# Patient Record
Sex: Female | Born: 1995 | Race: White | Hispanic: No | Marital: Married | Smoking: Never smoker
Health system: Southern US, Community
[De-identification: ages and names within clinical notes are randomized; demographics above are authoritative.]

## PROBLEM LIST (undated history)

## (undated) DIAGNOSIS — R000 Tachycardia, unspecified: Secondary | ICD-10-CM

## (undated) DIAGNOSIS — K219 Gastro-esophageal reflux disease without esophagitis: Secondary | ICD-10-CM

## (undated) DIAGNOSIS — M21611 Bunion of right foot: Secondary | ICD-10-CM

## (undated) HISTORY — PX: NO PAST SURGERIES: SHX2092

---

## 2017-04-30 ENCOUNTER — Emergency Department (HOSPITAL_COMMUNITY)
Admission: EM | Admit: 2017-04-30 | Discharge: 2017-04-30 | Disposition: A | Discharge status: Home / Self Care | Payer: BLUE CROSS/BLUE SHIELD | Attending: Emergency Medicine | Admitting: Emergency Medicine

## 2017-04-30 ENCOUNTER — Encounter (HOSPITAL_COMMUNITY): Payer: Self-pay

## 2017-04-30 ENCOUNTER — Emergency Department (HOSPITAL_COMMUNITY): Payer: BLUE CROSS/BLUE SHIELD

## 2017-04-30 DIAGNOSIS — R1084 Generalized abdominal pain: Secondary | ICD-10-CM | POA: Insufficient documentation

## 2017-04-30 DIAGNOSIS — R1032 Left lower quadrant pain: Secondary | ICD-10-CM | POA: Diagnosis present

## 2017-04-30 DIAGNOSIS — R112 Nausea with vomiting, unspecified: Secondary | ICD-10-CM | POA: Insufficient documentation

## 2017-04-30 LAB — COMPREHENSIVE METABOLIC PANEL
ALBUMIN: 4.3 g/dL (ref 3.5–5.0)
ALT: 26 U/L (ref 14–54)
AST: 22 U/L (ref 15–41)
Alkaline Phosphatase: 53 U/L (ref 38–126)
Anion gap: 9 (ref 5–15)
BUN: 11 mg/dL (ref 6–20)
CHLORIDE: 105 mmol/L (ref 101–111)
CO2: 25 mmol/L (ref 22–32)
CREATININE: 0.64 mg/dL (ref 0.44–1.00)
Calcium: 9.3 mg/dL (ref 8.9–10.3)
GFR calc non Af Amer: >60 mL/min (ref >60)
GLUCOSE: 92 mg/dL (ref 65–99)
Potassium: 3.6 mmol/L (ref 3.5–5.1)
SODIUM: 139 mmol/L (ref 135–145)
Total Bilirubin: 0.6 mg/dL (ref 0.3–1.2)
Total Protein: 7.8 g/dL (ref 6.5–8.1)

## 2017-04-30 LAB — CBC
HEMATOCRIT: 36.8 % (ref 36.0–46.0)
Hemoglobin: 11.9 g/dL — ABNORMAL LOW (ref 12.0–15.0)
MCH: 27.1 pg (ref 26.0–34.0)
MCHC: 32.3 g/dL (ref 30.0–36.0)
MCV: 83.8 fL (ref 78.0–100.0)
PLATELETS: 241 10*3/uL (ref 150–400)
RBC: 4.39 MIL/uL (ref 3.87–5.11)
RDW: 14.8 % (ref 11.5–15.5)
WBC: 6.4 10*3/uL (ref 4.0–10.5)

## 2017-04-30 LAB — URINALYSIS, ROUTINE W REFLEX MICROSCOPIC
BILIRUBIN URINE: NEGATIVE
GLUCOSE, UA: NEGATIVE mg/dL
HGB URINE DIPSTICK: NEGATIVE
KETONES UR: NEGATIVE mg/dL
LEUKOCYTES UA: NEGATIVE
Nitrite: NEGATIVE
PH: 6 (ref 5.0–8.0)
PROTEIN: NEGATIVE mg/dL
Specific Gravity, Urine: 1.012 (ref 1.005–1.030)

## 2017-04-30 LAB — WET PREP, GENITAL
SPERM: NONE SEEN
Trich, Wet Prep: NONE SEEN
Yeast Wet Prep HPF POC: NONE SEEN

## 2017-04-30 LAB — PREGNANCY, URINE: Preg Test, Ur: NEGATIVE

## 2017-04-30 LAB — LIPASE, BLOOD: LIPASE: 24 U/L (ref 11–51)

## 2017-04-30 MED ORDER — IOPAMIDOL (ISOVUE-300) INJECTION 61%
100.0000 mL | Freq: Once | INTRAVENOUS | Status: AC | PRN
  Administered 2017-04-30: 100 mL via INTRAVENOUS

## 2017-04-30 MED ORDER — NAPROXEN 500 MG PO TABS: 500.0000 mg | ORAL_TABLET | Freq: Two times a day (BID) | ORAL | 0 refills | Status: DC

## 2017-04-30 NOTE — ED Triage Notes (Signed)
Reports of pressure with urination and LLQ pain since Friday.

## 2017-04-30 NOTE — ED Provider Notes (Signed)
[facility] DEPT Provider Note   CSN: [id number] Arrival date & time: 04/30/17  1653     History   Chief Complaint Chief Complaint  Patient presents with  . Abdominal Pain    HPI Jessica Wu is a 21 y.o. female.  HPI   Jessica Wu is a 21 y.o. female who presents to the Emergency Department complaining of lower abdominal pain for 3 days. She states the pain began in the left lower quadrant and now radiates to the right and to her left flank.  Pain at times is sharp. It is not associated with food intake or movement. Pain is not improved with over-the-counter pain relievers.  She states the pain is waxing and waning at times and other times it is sharper and constant.  She denies fever, nausea or vomiting, dysuria, vaginal discharge and abnormal vaginal bleeding. She is on oral birth control, but states she has not been taking it regularly for several days stating that it exacerbates her pain.   History reviewed. No pertinent past medical history.  There are no active problems to display for this patient.   History reviewed. No pertinent surgical history.  OB History    No data available       Home Medications    Prior to Admission medications   Not on File    Family History No family history on file.  Social History Social History  Substance Use Topics  . Smoking status: Never Smoker  . Smokeless tobacco: Never Used  . Alcohol use No     Allergies   Patient has no known allergies.   Review of Systems Review of Systems  Constitutional: Negative for appetite change, chills and fever.  Respiratory: Negative for shortness of breath.   Cardiovascular: Negative for chest pain.  Gastrointestinal: Positive for abdominal pain, nausea and vomiting. Negative for blood in stool.  Genitourinary: Negative for decreased urine volume, difficulty urinating, dysuria and flank pain.  Musculoskeletal: Negative for back pain.  Skin: Negative for color  change and rash.  Neurological: Negative for dizziness, weakness and numbness.  Hematological: Negative for adenopathy.  All other systems reviewed and are negative.    Physical Exam Updated Vital Signs BP 134/85 (BP Location: Right Arm)   Pulse 91   Temp 98.7 F (37.1 C) (Oral)   Resp 18   Ht  (1.626 m)   Wt 61.2 kg (135 lb)   LMP 04/12/2017   SpO2 100%   BMI 23.17 kg/m   Physical Exam  Constitutional: She is oriented to person, place, and time. She appears well-developed and well-nourished. No distress.  HENT:  Head: Normocephalic and atraumatic.  Mouth/Throat: Oropharynx is clear and moist.  Cardiovascular: Normal rate, regular rhythm, normal heart sounds and intact distal pulses.   No murmur heard. Pulmonary/Chest: Effort normal and breath sounds normal. No respiratory distress.  Abdominal: Soft. Normal appearance and bowel sounds are normal. She exhibits no distension and no mass. There is no hepatosplenomegaly. There is tenderness in the right lower quadrant, left upper quadrant and left lower quadrant. There is no rebound, no guarding and no CVA tenderness.    Diffuse ttp of the lower abdomen and pelvis and LUQ.  No rebound or guarding.    Genitourinary: Uterus normal. There is no lesion or injury on the right labia. There is no lesion or injury on the left labia. Cervix exhibits no motion tenderness. Right adnexum displays no mass, no tenderness and no fullness. Left adnexum displays no mass,  no tenderness and no fullness. No tenderness or bleeding in the vagina. Vaginal discharge found.  Genitourinary Comments: Pelvic exam chaperoned by nursing.  Small amt of white vaginal discharge, no CMT, mild tenderness of left adnexa, no  masses or right  adnexal tenderness on exam.    Musculoskeletal: Normal range of motion. She exhibits no edema.  Neurological: She is alert and oriented to person, place, and time. She exhibits normal muscle tone. Coordination normal.  Skin:  Skin is warm and dry.  Psychiatric: She has a normal mood and affect.  Nursing note and vitals reviewed.    ED Treatments / Results  Labs (all labs ordered are listed, but only abnormal results are displayed) Labs Reviewed  CBC - Abnormal; Notable for the following:       Result Value   Hemoglobin 11.9 (*)    All other components within normal limits  URINALYSIS, ROUTINE W REFLEX MICROSCOPIC - Abnormal; Notable for the following:    Color, Urine STRAW (*)    All other components within normal limits  LIPASE, BLOOD  COMPREHENSIVE METABOLIC PANEL  PREGNANCY, URINE    EKG  EKG Interpretation None       Radiology Ct Abdomen Pelvis W Contrast  Result Date: 04/30/2017 CLINICAL DATA:  Lower abdominal pain for 3 days. Worsened with urination. EXAM: CT ABDOMEN AND PELVIS WITH CONTRAST TECHNIQUE: Multidetector CT imaging of the abdomen and pelvis was performed using the standard protocol following bolus administration of intravenous contrast. CONTRAST:  ISOVUE-300 IOPAMIDOL (ISOVUE-300) INJECTION 61% COMPARISON:  None. FINDINGS: Lower chest: No acute abnormality. Hepatobiliary: No focal liver abnormality is seen. No gallstones, gallbladder wall thickening, or biliary dilatation. Pancreas: Unremarkable. No pancreatic ductal dilatation or surrounding inflammatory changes. Spleen: Normal in size without focal abnormality. Adrenals/Urinary Tract: Adrenal glands are unremarkable. Kidneys are normal, without renal calculi, focal lesion, or hydronephrosis. Bladder is unremarkable. Stomach/Bowel: Stomach is within normal limits. Appendix is normal. No evidence of bowel wall thickening, distention, or inflammatory changes. Vascular/Lymphatic: No significant vascular findings are present. No enlarged abdominal or pelvic lymph nodes. Reproductive: Uterus and bilateral adnexa are unremarkable. Other: No focal inflammation.  Small volume free pelvic fluid. Musculoskeletal: No significant skeletal  abnormality. Tiny fat containing umbilical hernia. IMPRESSION: No significant abnormality. Electronically Signed   By: Ellery Plunk M.D.   On: 04/30/2017 18:55    Procedures Procedures (including critical care time)  Medications Ordered in ED Medications - No data to display   Initial Impression / Assessment and Plan / ED Course  I have reviewed the triage vital signs and the nursing notes.  Pertinent labs & imaging results that were available during my care of the patient were reviewed by me and considered in my medical decision making (see chart for details).     CT Abd/pelvis, labs and bimanual exam are reassuring.  Doubt acute abdomen.  Pt comfortable appearing.  Findings discussed.  Pt agrees to symptomatic tx, advised close PCP f/u.  Return ER precautions discussed.   Final Clinical Impressions(s) / ED Diagnoses   Final diagnoses:  Left lower quadrant pain    New Prescriptions New Prescriptions   No medications on file     Pauline Aus, Cordelia Poche 05/01/17 1547    Eber Hong, MD 05/01/17 2022

## 2017-04-30 NOTE — Discharge Instructions (Signed)
Labs and CT scan of your abdomen are reassuring.  Call your primary provider or GYN to arrange a follow-up appt.

## 2017-05-02 LAB — GC/CHLAMYDIA PROBE AMP (~~LOC~~) NOT AT ARMC
Chlamydia: NEGATIVE
Neisseria Gonorrhea: NEGATIVE

## 2017-06-23 DIAGNOSIS — M21611 Bunion of right foot: Secondary | ICD-10-CM

## 2017-06-23 HISTORY — DX: Bunion of right foot: M21.611

## 2017-06-25 ENCOUNTER — Other Ambulatory Visit: Payer: Self-pay | Admitting: Orthopedic Surgery

## 2017-06-29 ENCOUNTER — Other Ambulatory Visit: Payer: Self-pay

## 2017-06-29 ENCOUNTER — Encounter (HOSPITAL_BASED_OUTPATIENT_CLINIC_OR_DEPARTMENT_OTHER): Payer: Self-pay | Admitting: *Deleted

## 2017-07-05 ENCOUNTER — Ambulatory Visit (HOSPITAL_BASED_OUTPATIENT_CLINIC_OR_DEPARTMENT_OTHER): Payer: BLUE CROSS/BLUE SHIELD | Admitting: Certified Registered"

## 2017-07-05 ENCOUNTER — Other Ambulatory Visit: Payer: Self-pay

## 2017-07-05 ENCOUNTER — Ambulatory Visit (HOSPITAL_BASED_OUTPATIENT_CLINIC_OR_DEPARTMENT_OTHER)
Admission: RE | Admit: 2017-07-05 | Discharge: 2017-07-05 | Disposition: A | Discharge status: Home / Self Care | Payer: BLUE CROSS/BLUE SHIELD | Source: Ambulatory Visit | Attending: Orthopedic Surgery | Admitting: Orthopedic Surgery

## 2017-07-05 ENCOUNTER — Encounter (HOSPITAL_BASED_OUTPATIENT_CLINIC_OR_DEPARTMENT_OTHER): Payer: Self-pay | Admitting: *Deleted

## 2017-07-05 ENCOUNTER — Encounter (HOSPITAL_BASED_OUTPATIENT_CLINIC_OR_DEPARTMENT_OTHER)
Admission: RE | Disposition: A | Discharge status: Home / Self Care | Payer: Self-pay | Source: Ambulatory Visit | Attending: Orthopedic Surgery

## 2017-07-05 DIAGNOSIS — Z853 Personal history of malignant neoplasm of breast: Secondary | ICD-10-CM | POA: Insufficient documentation

## 2017-07-05 DIAGNOSIS — M21611 Bunion of right foot: Secondary | ICD-10-CM | POA: Diagnosis not present

## 2017-07-05 DIAGNOSIS — Z79899 Other long term (current) drug therapy: Secondary | ICD-10-CM | POA: Diagnosis not present

## 2017-07-05 DIAGNOSIS — Z793 Long term (current) use of hormonal contraceptives: Secondary | ICD-10-CM | POA: Insufficient documentation

## 2017-07-05 DIAGNOSIS — Z9889 Other specified postprocedural states: Secondary | ICD-10-CM

## 2017-07-05 HISTORY — DX: Bunion of right foot: M21.611

## 2017-07-05 HISTORY — PX: METATARSAL OSTEOTOMY WITH BUNIONECTOMY: SHX5662

## 2017-07-05 SURGERY — BUNIONECTOMY, WITH METATARSAL OSTEOTOMY
Anesthesia: General | Site: Foot | Laterality: Right

## 2017-07-05 MED ORDER — SCOPOLAMINE 1 MG/3DAYS TD PT72: 1.0000 | MEDICATED_PATCH | Freq: Once | TRANSDERMAL | Status: DC | PRN

## 2017-07-05 MED ORDER — CEFAZOLIN SODIUM-DEXTROSE 2-4 GM/100ML-% IV SOLN
2.0000 g | INTRAVENOUS | Status: AC
  Administered 2017-07-05: 2 g via INTRAVENOUS

## 2017-07-05 MED ORDER — MEPERIDINE HCL 25 MG/ML IJ SOLN: 6.2500 mg | INTRAMUSCULAR | Status: DC | PRN

## 2017-07-05 MED ORDER — LACTATED RINGERS IV SOLN
INTRAVENOUS | Status: DC
  Administered 2017-07-05 (×2): via INTRAVENOUS

## 2017-07-05 MED ORDER — FENTANYL CITRATE (PF) 100 MCG/2ML IJ SOLN
50.0000 ug | INTRAMUSCULAR | Status: DC | PRN
  Administered 2017-07-05: 100 ug via INTRAVENOUS

## 2017-07-05 MED ORDER — SENNA 8.6 MG PO TABS: 2.0000 | ORAL_TABLET | Freq: Two times a day (BID) | ORAL | 0 refills | Status: DC

## 2017-07-05 MED ORDER — FENTANYL CITRATE (PF) 100 MCG/2ML IJ SOLN: 25.0000 ug | INTRAMUSCULAR | Status: DC | PRN

## 2017-07-05 MED ORDER — LIDOCAINE HCL (CARDIAC) 20 MG/ML IV SOLN
INTRAVENOUS | Status: DC | PRN
  Administered 2017-07-05: 30 mg via INTRAVENOUS

## 2017-07-05 MED ORDER — DEXAMETHASONE SODIUM PHOSPHATE 10 MG/ML IJ SOLN
INTRAMUSCULAR | Status: DC | PRN
  Administered 2017-07-05: 10 mg via INTRAVENOUS

## 2017-07-05 MED ORDER — ONDANSETRON HCL 4 MG/2ML IJ SOLN
INTRAMUSCULAR | Status: DC | PRN
  Administered 2017-07-05: 4 mg via INTRAVENOUS

## 2017-07-05 MED ORDER — 0.9 % SODIUM CHLORIDE (POUR BTL) OPTIME
TOPICAL | Status: DC | PRN
  Administered 2017-07-05: 150 mL

## 2017-07-05 MED ORDER — SODIUM CHLORIDE 0.9 % IV SOLN: INTRAVENOUS | Status: DC

## 2017-07-05 MED ORDER — ONDANSETRON HCL 4 MG/2ML IJ SOLN: 4.0000 mg | Freq: Once | INTRAMUSCULAR | Status: DC | PRN

## 2017-07-05 MED ORDER — DOCUSATE SODIUM 100 MG PO CAPS: 100.0000 mg | ORAL_CAPSULE | Freq: Two times a day (BID) | ORAL | 0 refills | Status: DC

## 2017-07-05 MED ORDER — FENTANYL CITRATE (PF) 100 MCG/2ML IJ SOLN
INTRAMUSCULAR | Status: AC
  Filled 2017-07-05: qty 2

## 2017-07-05 MED ORDER — PROPOFOL 10 MG/ML IV BOLUS
INTRAVENOUS | Status: DC | PRN
  Administered 2017-07-05: 150 mg via INTRAVENOUS

## 2017-07-05 MED ORDER — CHLORHEXIDINE GLUCONATE 4 % EX LIQD: 60.0000 mL | Freq: Once | CUTANEOUS | Status: DC

## 2017-07-05 MED ORDER — MIDAZOLAM HCL 2 MG/2ML IJ SOLN
INTRAMUSCULAR | Status: AC
  Filled 2017-07-05: qty 2

## 2017-07-05 MED ORDER — CEFAZOLIN SODIUM-DEXTROSE 2-4 GM/100ML-% IV SOLN
INTRAVENOUS | Status: AC
  Filled 2017-07-05: qty 100

## 2017-07-05 MED ORDER — MIDAZOLAM HCL 2 MG/2ML IJ SOLN
1.0000 mg | INTRAMUSCULAR | Status: DC | PRN
  Administered 2017-07-05: 2 mg via INTRAVENOUS

## 2017-07-05 MED ORDER — OXYCODONE HCL 5 MG PO TABS: 5.0000 mg | ORAL_TABLET | ORAL | 0 refills | Status: DC | PRN

## 2017-07-05 MED ORDER — ROPIVACAINE HCL 7.5 MG/ML IJ SOLN
INTRAMUSCULAR | Status: DC | PRN
  Administered 2017-07-05: 25 mL via PERINEURAL

## 2017-07-05 SURGICAL SUPPLY — 66 items
BANDAGE ESMARK 6X9 LF (GAUZE/BANDAGES/DRESSINGS) IMPLANT
BIT DRILL 1.5X30 QC DISP (BIT) ×2 IMPLANT
BLADE AVERAGE 25X9 (BLADE) ×2 IMPLANT
BLADE LONG MED 25X9 (BLADE) IMPLANT
BLADE MICRO SAGITTAL (BLADE) IMPLANT
BLADE SURG 15 STRL LF DISP TIS (BLADE) ×2 IMPLANT
BLADE SURG 15 STRL SS (BLADE) ×2
BNDG COHESIVE 4X5 TAN STRL (GAUZE/BANDAGES/DRESSINGS) ×2 IMPLANT
BNDG COHESIVE 6X5 TAN STRL LF (GAUZE/BANDAGES/DRESSINGS) IMPLANT
BNDG CONFORM 3 STRL LF (GAUZE/BANDAGES/DRESSINGS) ×2 IMPLANT
BNDG ESMARK 6X9 LF (GAUZE/BANDAGES/DRESSINGS)
CHLORAPREP W/TINT 26ML (MISCELLANEOUS) ×2 IMPLANT
COVER BACK TABLE 60X90IN (DRAPES) ×2 IMPLANT
CUFF TOURNIQUET SINGLE 24IN (TOURNIQUET CUFF) IMPLANT
CUFF TOURNIQUET SINGLE 34IN LL (TOURNIQUET CUFF) ×2 IMPLANT
DRAPE EXTREMITY T 121X128X90 (DRAPE) ×2 IMPLANT
DRAPE OEC MINIVIEW 54X84 (DRAPES) ×2 IMPLANT
DRAPE U-SHAPE 47X51 STRL (DRAPES) ×2 IMPLANT
DRSG MEPITEL 4X7.2 (GAUZE/BANDAGES/DRESSINGS) ×2 IMPLANT
DRSG PAD ABDOMINAL 8X10 ST (GAUZE/BANDAGES/DRESSINGS) ×2 IMPLANT
ELECT REM PT RETURN 9FT ADLT (ELECTROSURGICAL) ×2
ELECTRODE REM PT RTRN 9FT ADLT (ELECTROSURGICAL) ×1 IMPLANT
GAUZE SPONGE 4X4 12PLY STRL (GAUZE/BANDAGES/DRESSINGS) ×2 IMPLANT
GLOVE BIO SURGEON STRL SZ8 (GLOVE) ×2 IMPLANT
GLOVE BIOGEL PI IND STRL 7.0 (GLOVE) ×2 IMPLANT
GLOVE BIOGEL PI IND STRL 8 (GLOVE) ×2 IMPLANT
GLOVE BIOGEL PI INDICATOR 7.0 (GLOVE) ×2
GLOVE BIOGEL PI INDICATOR 8 (GLOVE) ×2
GLOVE ECLIPSE 6.5 STRL STRAW (GLOVE) ×2 IMPLANT
GLOVE ECLIPSE 8.0 STRL XLNG CF (GLOVE) ×2 IMPLANT
GOWN STRL REUS W/ TWL LRG LVL3 (GOWN DISPOSABLE) ×1 IMPLANT
GOWN STRL REUS W/ TWL XL LVL3 (GOWN DISPOSABLE) ×2 IMPLANT
GOWN STRL REUS W/TWL LRG LVL3 (GOWN DISPOSABLE) ×1
GOWN STRL REUS W/TWL XL LVL3 (GOWN DISPOSABLE) ×2
GUIDEWIRE .08 (WIRE) IMPLANT
K-WIRE .054X4 (WIRE) ×2 IMPLANT
NEEDLE HYPO 25X1 1.5 SAFETY (NEEDLE) IMPLANT
NS IRRIG 1000ML POUR BTL (IV SOLUTION) ×2 IMPLANT
PACK BASIN DAY SURGERY FS (CUSTOM PROCEDURE TRAY) ×2 IMPLANT
PAD CAST 4YDX4 CTTN HI CHSV (CAST SUPPLIES) ×1 IMPLANT
PADDING CAST ABS 4INX4YD NS (CAST SUPPLIES)
PADDING CAST ABS COTTON 4X4 ST (CAST SUPPLIES) IMPLANT
PADDING CAST COTTON 4X4 STRL (CAST SUPPLIES) ×1
PENCIL BUTTON HOLSTER BLD 10FT (ELECTRODE) ×2 IMPLANT
SANITIZER HAND PURELL 535ML FO (MISCELLANEOUS) ×2 IMPLANT
SCREW 2.0 CORTICAL FT 20MM (Screw) ×2 IMPLANT
SCREW CORTICAL 2.0X18MM (Screw) ×2 IMPLANT
SHEET MEDIUM DRAPE 40X70 STRL (DRAPES) ×2 IMPLANT
SLEEVE SCD COMPRESS KNEE MED (MISCELLANEOUS) ×2 IMPLANT
SPONGE LAP 18X18 X RAY DECT (DISPOSABLE) ×2 IMPLANT
STOCKINETTE 6  STRL (DRAPES) ×1
STOCKINETTE 6 STRL (DRAPES) ×1 IMPLANT
SUCTION FRAZIER HANDLE 10FR (MISCELLANEOUS) ×1
SUCTION TUBE FRAZIER 10FR DISP (MISCELLANEOUS) ×1 IMPLANT
SUT ETHIBOND 3-0 V-5 (SUTURE) IMPLANT
SUT ETHILON 3 0 PS 1 (SUTURE) ×4 IMPLANT
SUT MNCRL AB 3-0 PS2 18 (SUTURE) ×2 IMPLANT
SUT VIC AB 0 SH 27 (SUTURE) IMPLANT
SUT VIC AB 2-0 SH 27 (SUTURE) ×1
SUT VIC AB 2-0 SH 27XBRD (SUTURE) ×1 IMPLANT
SUT VICRYL 0 UR6 27IN ABS (SUTURE) IMPLANT
SYR BULB 3OZ (MISCELLANEOUS) ×2 IMPLANT
SYR CONTROL 10ML LL (SYRINGE) IMPLANT
TOWEL OR 17X24 6PK STRL BLUE (TOWEL DISPOSABLE) ×4 IMPLANT
TUBE CONNECTING 20X1/4 (TUBING) ×2 IMPLANT
UNDERPAD 30X30 (UNDERPADS AND DIAPERS) ×2 IMPLANT

## 2017-07-05 NOTE — Anesthesia Preprocedure Evaluation (Signed)
Anesthesia Evaluation  Patient identified by MRN, date of birth, ID band Patient awake    Reviewed: Allergy & Precautions, NPO status , Patient's Chart, lab work & pertinent test results  Airway Mallampati: II  TM Distance: >3 FB Neck ROM: Full    Dental no notable dental hx.    Pulmonary neg pulmonary ROS,    Pulmonary exam normal breath sounds clear to auscultation       Cardiovascular negative cardio ROS Normal cardiovascular exam Rhythm:Regular Rate:Normal     Neuro/Psych negative neurological ROS  negative psych ROS   GI/Hepatic negative GI ROS, Neg liver ROS,   Endo/Other  negative endocrine ROS  Renal/GU negative Renal ROS  negative genitourinary   Musculoskeletal negative musculoskeletal ROS (+)   Abdominal   Peds negative pediatric ROS (+)  Hematology negative hematology ROS (+)   Anesthesia Other Findings   Reproductive/Obstetrics negative OB ROS                             Anesthesia Physical Anesthesia Plan  ASA: II  Anesthesia Plan: General   Post-op Pain Management: GA combined w/ Regional for post-op pain   Induction: Intravenous  PONV Risk Score and Plan: 3 and Ondansetron, Dexamethasone, Treatment may vary due to age or medical condition and Midazolam  Airway Management Planned: LMA  Additional Equipment:   Intra-op Plan:   Post-operative Plan: Extubation in OR  Informed Consent: I have reviewed the patients History and Physical, chart, labs and discussed the procedure including the risks, benefits and alternatives for the proposed anesthesia with the patient or authorized representative who has indicated his/her understanding and acceptance.     Plan Discussed with: CRNA and Surgeon  Anesthesia Plan Comments: ( )        Anesthesia Quick Evaluation

## 2017-07-05 NOTE — H&P (Signed)
Jessica Wu is an 21 y.o. female.   Chief Complaint: right foot pain HPI: The patient is a 21 year old female without significant past medical history.  She has a painful bunion deformity of her right foot.  She has failed nonoperative treatment to date including activity modification, oral anti-inflammatories and shoe wear modification.  She presents today for first metatarsal scarf osteotomy and modified McBride bunionectomy.  Past Medical History:  Diagnosis Date  . Bunion of right foot 06/2017    Past Surgical History:  Procedure Laterality Date  . NO PAST SURGERIES      History reviewed. No pertinent family history. Social History:  reports that  has never smoked. she has never used smokeless tobacco. She reports that she does not drink alcohol or use drugs.  Allergies:  Allergies  Allergen Reactions  . Soap Other (See Comments)    SCENTED SOAPS/LOTIONS - SKIN IRRITATION    Medications Prior to Admission  Medication Sig Dispense Refill  . Biotin 1000 MCG tablet Take 1,000 mcg by mouth daily.     . norgestimate-ethinyl estradiol (ORTHO-CYCLEN,SPRINTEC,PREVIFEM) 0.25-35 MG-MCG tablet Take 1 tablet by mouth daily.      No results found for this or any previous visit (from the past 48 hour(s)). No results found.  ROS no recent fever, chills, nausea, vomiting, changes in her appetite or weight loss.  Blood pressure 121/77, pulse 90, resp. rate 17, height 5\' 4"  (1.626 m), weight 67.6 kg (149 lb), last menstrual period 07/03/2017, SpO2 100 %. Physical Exam  Well-nourished well-developed young woman in no apparent distress.  Alert and oriented x4.  Mood and affect are normal.  Extraocular motions are intact.  Respirations are unlabored.  Her gait is normal.  On standing exam she has a moderate bunion deformity.  Skin is healthy and intact.  No lymphadenopathy.  Pulses are palpable.  Sensibility to light touch is intact dorsally and plantarly at the forefoot.  5 out of 5  strength in plantar flexion and dorsi flexion of the ankle and toes.  Assessment/Plan Right foot bunion deformity-to operating room for operative treatment.The risks and benefits of the alternative treatment options have been discussed in detail.  The patient wishes to proceed with surgery and specifically understands risks of bleeding, infection, nerve damage, blood clots, need for additional surgery, amputation and death.   Jessica Wu, Jessica Gatling, MD 07/05/2017, 1:14 PM

## 2017-07-05 NOTE — Progress Notes (Signed)
Assisted Dr. Oddono with right, ultrasound guided, popliteal block. Side rails up, monitors on throughout procedure. See vital signs in flow sheet. Tolerated Procedure well. 

## 2017-07-05 NOTE — Transfer of Care (Signed)
Immediate Anesthesia Transfer of Care Note  Patient: Jessica Wu  Procedure(s) Performed: Right First Metatarsal Scarf Osteotomy and Modified McBride Bunionectomy (Right Foot)  Patient Location: PACU  Anesthesia Type:General  Level of Consciousness: awake and sedated  Airway & Oxygen Therapy: Patient Spontanous Breathing and Patient connected to face mask oxygen  Post-op Assessment: Report given to RN and Post -op Vital signs reviewed and stable  Post vital signs: Reviewed and stable  Last Vitals:  Vitals:   07/05/17 1305 07/05/17 1310  BP: (!) 101/59 121/77  Pulse: 71 90  Resp: 15 17  SpO2: 100% 100%    Last Pain:  Vitals:   07/05/17 1227  TempSrc: Oral         Complications: No apparent anesthesia complications

## 2017-07-05 NOTE — Op Note (Signed)
07/05/2017  2:26 PM  PATIENT:  Jessica Wu  21 y.o. female  PRE-OPERATIVE DIAGNOSIS:  Right foot painful bunion  POST-OPERATIVE DIAGNOSIS:  same  Procedure(s): 1.  Right First Metatarsal Scarf Osteotomy and Modified McBride Bunionectomy   2.  Right foot AP and lateral xrays  SURGEON:  Toni ArthursJohn Neasia Fleeman, MD  ASSISTANT: Alfredo MartinezJustin Ollis, PA-C  ANESTHESIA:   General, regional  EBL:  minimal  TOURNIQUET:   Total Tourniquet Time Documented: Thigh (Right) - 42 minutes Total: Thigh (Right) - 42 minutes  COMPLICATIONS:  None apparent  DISPOSITION:  Extubated, awake and stable to recovery.  INDICATION FOR PROCEDURE: The patient is a 21 year old female who has a long history of a painful right foot bunion deformity.  She has failed nonoperative treatment to date including activity modification, oral anti-inflammatories and shoewear modification.  She presents now for operative treatment of this painful bunion.The risks and benefits of the alternative treatment options have been discussed in detail.  The patient wishes to proceed with surgery and specifically understands risks of bleeding, infection, nerve damage, blood clots, need for additional surgery, amputation and death.  PROCEDURE IN DETAIL:  After pre operative consent was obtained, and the correct operative site was identified, the patient was brought to the operating room and placed supine on the OR table.  Anesthesia was administered.  Pre-operative antibiotics were administered.  A surgical timeout was taken.  The right lower extremity was then prepped and draped in standard sterile fashion with a tourniquet around the thigh.  The extremity was exsanguinated and the tourniquet was inflated to 250 mmHg.  A longitudinal incision was made at the dorsum of the first web space.  Dissection was carried down through the subcutaneous tissues.  The intermetatarsal ligament was divided under direct vision.  An arthrotomy was then made between the  lateral sesamoid and first metatarsal head.  Small perforations were made in the lateral joint capsule.  The hallux could then be positioned in 20 degrees of varus passively.  Attention was turned to the medial forefoot where a longitudinal incision was made.  Dissection was carried down through the subtenons tissues in the medial joint capsule was incised and elevated plantarly and dorsally.  The medial eminence was resected in line the first metatarsal shaft.  A scarf osteotomy was then made in the first metatarsal shaft medially.  The metatarsal head was translated laterally.  A second oblique cut was made in the dorsal limb of the scarf osteotomy to allow correction of the distal metatarsal articular angle.  Once adequate correction was achieved the fragments were pinned.  AP and lateral radiographs confirmed appropriate correction of the intermetatarsal and hallux valgus angles.  The osteotomy was then fixed with 2 Biomet 2 mm fully threaded cortical screws.  The overhanging bone was then trimmed with the oscillating saw.  The wound was irrigated copiously.  Medial joint capsule was then repaired with indicating sutures of 2-0 Vicryl.  Subtenons tissues were approximated with Monocryl.  Skin incisions were closed with nylon.  Sterile dressings were applied followed by a bunion wrap.  Tourniquet was released after application of the dressings.  The patient was awakened from anesthesia and transported to the recovery room in stable condition.  FOLLOW UP PLAN: Weightbearing as tolerated in a Darco wedge style shoe.  Follow-up with me in 2 weeks for suture removal and toe spacer.  RADIOGRAPHS: AP and lateral radiographs of the right foot are obtained intraoperatively.  These show interval correction of the hallux valgus  and intermetatarsal angles.  Hardware is appropriate the position of the appropriate lengths.    Alfredo MartinezJustin Ollis PA-C was present and scrubbed for the duration of the operative case. His  assistance assistance was essential in positioning the patient, prepping and draping, gaining maintaining exposure, performing the operation, closing and dressing the wounds and applying the splint.

## 2017-07-05 NOTE — Anesthesia Procedure Notes (Signed)
Anesthesia Regional Block: Popliteal block   Pre-Anesthetic Checklist: ,, timeout performed, Correct Patient, Correct Site, Correct Laterality, Correct Procedure, Correct Position, site marked, Risks and benefits discussed,  Surgical consent,  Pre-op evaluation,  At surgeon's request and post-op pain management  Laterality: Right  Prep: chloraprep       Needles:  Injection technique: Single-shot  Needle Type: Echogenic Stimulator Needle     Needle Length: 5cm  Needle Gauge: 22     Additional Needles:   Procedures:,,,, ultrasound used (permanent image in chart),,,,  Narrative:  Start time: 07/05/2017 1:01 PM End time: 07/05/2017 1:08 PM Injection made incrementally with aspirations every 5 mL.  Performed by: Personally  Anesthesiologist: Bethena Midgetddono, Ermel Verne, MD  Additional Notes: Functioning IV was confirmed and monitors were applied.  A 50mm 22ga Arrow echogenic stimulator needle was used. Sterile prep and drape,hand hygiene and sterile gloves were used. Ultrasound guidance: relevant anatomy identified, needle position confirmed, local anesthetic spread visualized around nerve(s)., vascular puncture avoided.  Image printed for medical record. Negative aspiration and negative test dose prior to incremental administration of local anesthetic. The patient tolerated the procedure well.

## 2017-07-05 NOTE — Anesthesia Procedure Notes (Signed)
Procedure Name: LMA Insertion Date/Time: 07/05/2017 1:22 PM Performed by: Sheryn BisonBlocker, Shalaina Guardiola D, CRNA Pre-anesthesia Checklist: Patient identified, Emergency Drugs available, Suction available and Patient being monitored Patient Re-evaluated:Patient Re-evaluated prior to induction Oxygen Delivery Method: Circle system utilized Preoxygenation: Pre-oxygenation with 100% oxygen Induction Type: IV induction Ventilation: Mask ventilation without difficulty LMA: LMA inserted LMA Size: 4.0 Number of attempts: 1 Airway Equipment and Method: Bite block Placement Confirmation: positive ETCO2 Tube secured with: Tape Dental Injury: Teeth and Oropharynx as per pre-operative assessment

## 2017-07-05 NOTE — Anesthesia Postprocedure Evaluation (Signed)
Anesthesia Post Note  Patient: Jessica Wu  Procedure(s) Performed: Right First Metatarsal Scarf Osteotomy and Modified McBride Bunionectomy (Right Foot)     Patient location during evaluation: PACU Anesthesia Type: General Level of consciousness: awake and alert Pain management: pain level controlled Vital Signs Assessment: post-procedure vital signs reviewed and stable Respiratory status: spontaneous breathing, nonlabored ventilation, respiratory function stable and patient connected to nasal cannula oxygen Cardiovascular status: blood pressure returned to baseline and stable Postop Assessment: no apparent nausea or vomiting Anesthetic complications: no    Last Vitals:  Vitals:   07/05/17 1305 07/05/17 1310  BP: (!) 101/59 121/77  Pulse: 71 90  Resp: 15 17  SpO2: 100% 100%    Last Pain:  Vitals:   07/05/17 1227  TempSrc: Oral                 Solan Vosler

## 2017-07-05 NOTE — Discharge Instructions (Addendum)
Jessica ArthursJohn Hewitt, MD [facility]  Please read the following information regarding your care after surgery.  Medications  You only need a prescription for the narcotic pain medicine (ex. oxycodone, Percocet, Norco).  All of the other medicines listed below are available over the counter. X Aleve 2 pills twice a day for the first 3 days after surgery. X acetominophen (Tylenol) 650 mg every 4-6 hours as you need for minor to moderate pain X oxycodone as prescribed for severe pain  Narcotic pain medicine (ex. oxycodone, Percocet, Vicodin) will cause constipation.  To prevent this problem, take the following medicines while you are taking any pain medicine. X docusate sodium (Colace) 100 mg twice a day X senna (Senokot) 2 tablets twice a day  Weight Bearing X Bear weight only on your operated foot in the darco wedge post-op shoe.   Cast / Splint / Dressing X Keep your splint, cast or dressing clean and dry.  Dont put anything (coat hanger, pencil, etc) down inside of it.  If it gets damp, use a hair dryer on the cool setting to dry it.  If it gets soaked, call the office to schedule an appointment for a cast change.   After your dressing, cast or splint is removed; you may shower, but do not soak or scrub the wound.  Allow the water to run over it, and then gently pat it dry.  Swelling It is normal for you to have swelling where you had surgery.  To reduce swelling and pain, keep your toes above your nose for at least 3 days after surgery.  It may be necessary to keep your foot or leg elevated for several weeks.  If it hurts, it should be elevated.  Follow Up Call my office at [phone] when you are discharged from the hospital or surgery [facility] to schedule an appointment to be seen two weeks after surgery.  Call my office at [phone] if you develop a fever >101.5 F, nausea, vomiting, bleeding from the surgical site or severe pain.     Post Anesthesia Home Care  Instructions  Activity: Get plenty of rest for the remainder of the day. A responsible individual must stay with you for 24 hours following the procedure.  For the next 24 hours, DO NOT: -Drive a car -Advertising copywriterperate machinery -Drink alcoholic beverages -Take any medication unless instructed by your physician -Make any legal decisions or sign important papers.  Meals: Start with liquid foods such as gelatin or soup. Progress to regular foods as tolerated. Avoid greasy, spicy, heavy foods. If nausea and/or vomiting occur, drink only clear liquids until the nausea and/or vomiting subsides. Call your physician if vomiting continues.  Special Instructions/Symptoms: Your throat may feel dry or sore from the anesthesia or the breathing tube placed in your throat during surgery. If this causes discomfort, gargle with warm salt water. The discomfort should disappear within 24 hours.  If you had a scopolamine patch placed behind your ear for the management of post- operative nausea and/or vomiting:  1. The medication in the patch is effective for 72 hours, after which it should be removed.  Wrap patch in a tissue and discard in the trash. Wash hands thoroughly with soap and water. 2. You may remove the patch earlier than 72 hours if you experience unpleasant side effects which may include dry mouth, dizziness or visual disturbances. 3. Avoid touching the patch. Wash your hands with soap and water after contact with the patch.   Regional Anesthesia Blocks  1. Numbness or the inability to move the "blocked" extremity may last from 3-48 hours after placement. The length of time depends on the medication injected and your individual response to the medication. If the numbness is not going away after 48 hours, call your surgeon.  2. The extremity that is blocked will need to be protected until the numbness is gone and the  Strength has returned. Because you cannot feel it, you will need to take extra care to  avoid injury. Because it may be weak, you may have difficulty moving it or using it. You may not know what position it is in without looking at it while the block is in effect.  3. For blocks in the legs and feet, returning to weight bearing and walking needs to be done carefully. You will need to wait until the numbness is entirely gone and the strength has returned. You should be able to move your leg and foot normally before you try and bear weight or walk. You will need someone to be with you when you first try to ensure you do not fall and possibly risk injury.  4. Bruising and tenderness at the needle site are common side effects and will resolve in a few days.  5. Persistent numbness or new problems with movement should be communicated to the surgeon or the [facility] [phone])/ [facility] [phone]).

## 2017-07-05 NOTE — Addendum Note (Signed)
Addendum  created 07/05/17 1432 by Bethena Midgetddono, Simone Tuckey, MD   Order list changed, Order sets accessed

## 2017-07-06 ENCOUNTER — Encounter (HOSPITAL_BASED_OUTPATIENT_CLINIC_OR_DEPARTMENT_OTHER): Payer: Self-pay | Admitting: Orthopedic Surgery

## 2017-10-10 ENCOUNTER — Encounter: Payer: Self-pay | Admitting: Physician Assistant

## 2017-10-10 ENCOUNTER — Ambulatory Visit: Payer: BLUE CROSS/BLUE SHIELD | Admitting: Physician Assistant

## 2017-10-10 VITALS — BP 128/80 | HR 104 | Temp 98.0°F | Ht 64.5 in | Wt 157.0 lb

## 2017-10-10 DIAGNOSIS — N946 Dysmenorrhea, unspecified: Secondary | ICD-10-CM

## 2017-10-10 MED ORDER — NORETHIN-ETH ESTRAD-FE BIPHAS 1 MG-10 MCG / 10 MCG PO TABS: 1.0000 | ORAL_TABLET | Freq: Every day | ORAL | 11 refills | Status: DC

## 2017-10-11 LAB — CMP14+EGFR
ALT: 26 IU/L (ref 0–32)
AST: 18 IU/L (ref 0–40)
Albumin/Globulin Ratio: 1.7 (ref 1.2–2.2)
Albumin: 4.3 g/dL (ref 3.5–5.5)
Alkaline Phosphatase: 62 IU/L (ref 39–117)
BILIRUBIN TOTAL: 0.3 mg/dL (ref 0.0–1.2)
BUN/Creatinine Ratio: 26 — ABNORMAL HIGH (ref 9–23)
BUN: 17 mg/dL (ref 6–20)
CHLORIDE: 106 mmol/L (ref 96–106)
CO2: 24 mmol/L (ref 20–29)
CREATININE: 0.66 mg/dL (ref 0.57–1.00)
Calcium: 9.3 mg/dL (ref 8.7–10.2)
GFR calc Af Amer: 147 mL/min/{1.73_m2} (ref >59)
GFR calc non Af Amer: 128 mL/min/{1.73_m2} (ref >59)
GLOBULIN, TOTAL: 2.6 g/dL (ref 1.5–4.5)
GLUCOSE: 78 mg/dL (ref 65–99)
Potassium: 4.3 mmol/L (ref 3.5–5.2)
SODIUM: 144 mmol/L (ref 134–144)
Total Protein: 6.9 g/dL (ref 6.0–8.5)

## 2017-10-11 LAB — CBC WITH DIFFERENTIAL/PLATELET
BASOS ABS: 0 10*3/uL (ref 0.0–0.2)
Basos: 0 %
EOS (ABSOLUTE): 0.1 10*3/uL (ref 0.0–0.4)
Eos: 2 %
Hematocrit: 36.7 % (ref 34.0–46.6)
Hemoglobin: 11.9 g/dL (ref 11.1–15.9)
Immature Grans (Abs): 0 10*3/uL (ref 0.0–0.1)
Immature Granulocytes: 0 %
LYMPHS ABS: 2.5 10*3/uL (ref 0.7–3.1)
Lymphs: 36 %
MCH: 27.6 pg (ref 26.6–33.0)
MCHC: 32.4 g/dL (ref 31.5–35.7)
MCV: 85 fL (ref 79–97)
MONOS ABS: 0.6 10*3/uL (ref 0.1–0.9)
Monocytes: 9 %
NEUTROS ABS: 3.6 10*3/uL (ref 1.4–7.0)
Neutrophils: 53 %
Platelets: 276 10*3/uL (ref 150–379)
RBC: 4.31 x10E6/uL (ref 3.77–5.28)
RDW: 15.4 % (ref 12.3–15.4)
WBC: 6.7 10*3/uL (ref 3.4–10.8)

## 2017-10-11 NOTE — Progress Notes (Signed)
BP 128/80   Pulse (!) 104   Temp 98 F (36.7 C) (Oral)   Ht 5' 4.5" (1.638 m)   Wt 157 lb (71.2 kg)   BMI 26.53 kg/m    Subjective:    Patient ID: Jessica [facility], female    DOB: [birth year], 22 y.o.   MRN: [id number]  HPI: Jessica Wu is a 22 y.o. female presenting on 10/10/2017 for New Patient (Initial Visit)  Patient comes in to discuss starting birth control.  She would like to start pills a possible.  There is when she started and that made her feel too sick.  She does not know of any other health problems she has that should interfere with this.  At this time she does have extremely heavy cycles on the first 2 or 3 days.  It would last 4-5 days.  She does have associated cramps with that.  Past Medical History:  Diagnosis Date  . Bunion of right foot 06/2017   Relevant past medical, surgical, family and social history reviewed and updated as indicated. Interim medical history since our last visit reviewed. Allergies and medications reviewed and updated. DATA REVIEWED: CHART IN EPIC  Family History reviewed for pertinent findings.  Review of Systems  Constitutional: Negative.   HENT: Negative.   Eyes: Negative.   Respiratory: Negative.   Gastrointestinal: Negative.   Genitourinary: Negative.     Allergies as of 10/10/2017      Reactions   Soap Other (See Comments)   SCENTED SOAPS/LOTIONS - SKIN IRRITATION      Medication List        Accurate as of 10/10/17 11:59 PM. Always use your most recent med list.          Norethindrone-Ethinyl Estradiol-Fe Biphas 1 MG-10 MCG / 10 MCG tablet Commonly known as:  LO LOESTRIN FE Take 1 tablet by mouth daily.          Objective:    BP 128/80   Pulse (!) 104   Temp 98 F (36.7 C) (Oral)   Ht 5' 4.5" (1.638 m)   Wt 157 lb (71.2 kg)   BMI 26.53 kg/m   Allergies  Allergen Reactions  . Soap Other (See Comments)    SCENTED SOAPS/LOTIONS - SKIN IRRITATION    Wt Readings from Last 3 Encounters:    10/10/17 157 lb (71.2 kg)  07/05/17 149 lb (67.6 kg) (79 %, Z= 0.81)*  04/30/17 135 lb (61.2 kg) (62 %, Z= 0.31)*   * Growth percentiles are based on CDC (Girls, 2-20 Years) data.    Physical Exam  Constitutional: She is oriented to person, place, and time. She appears well-developed and well-nourished.  HENT:  Head: Normocephalic and atraumatic.  Eyes: Pupils are equal, round, and reactive to light. Conjunctivae and EOM are normal.  Cardiovascular: Normal rate, regular rhythm, normal heart sounds and intact distal pulses.  Pulmonary/Chest: Effort normal and breath sounds normal.  Abdominal: Soft. Bowel sounds are normal.  Neurological: She is alert and oriented to person, place, and time. She has normal reflexes.  Skin: Skin is warm and dry. No rash noted.  Psychiatric: She has a normal mood and affect. Her behavior is normal. Judgment and thought content normal.    Results for orders placed or performed in visit on 10/10/17  CBC with Differential/Platelet  Result Value Ref Range   WBC 6.7 3.4 - 10.8 x10E3/uL   RBC 4.31 3.77 - 5.28 x10E6/uL   Hemoglobin 11.9 11.1 - 15.9 g/dL  Hematocrit 36.7 34.0 - 46.6 %   MCV 85 79 - 97 fL   MCH 27.6 26.6 - 33.0 pg   MCHC 32.4 31.5 - 35.7 g/dL   RDW 15.4 12.3 - 15.4 %   Platelets 276 150 - 379 x10E3/uL   Neutrophils 53 Not Estab. %   Lymphs 36 Not Estab. %   Monocytes 9 Not Estab. %   Eos 2 Not Estab. %   Basos 0 Not Estab. %   Neutrophils Absolute 3.6 1.4 - 7.0 x10E3/uL   Lymphocytes Absolute 2.5 0.7 - 3.1 x10E3/uL   Monocytes Absolute 0.6 0.1 - 0.9 x10E3/uL   EOS (ABSOLUTE) 0.1 0.0 - 0.4 x10E3/uL   Basophils Absolute 0.0 0.0 - 0.2 x10E3/uL   Immature Granulocytes 0 Not Estab. %   Immature Grans (Abs) 0.0 0.0 - 0.1 x10E3/uL  CMP14+EGFR  Result Value Ref Range   Glucose 78 65 - 99 mg/dL   BUN 17 6 - 20 mg/dL   Creatinine, Ser 0.66 0.57 - 1.00 mg/dL   GFR calc non Af Amer 128 >59 mL/min/1.73   GFR calc Af Amer 147 >59 mL/min/1.73    BUN/Creatinine Ratio 26 (H) 9 - 23   Sodium 144 134 - 144 mmol/L   Potassium 4.3 3.5 - 5.2 mmol/L   Chloride 106 96 - 106 mmol/L   CO2 24 20 - 29 mmol/L   Calcium 9.3 8.7 - 10.2 mg/dL   Total Protein 6.9 6.0 - 8.5 g/dL   Albumin 4.3 3.5 - 5.5 g/dL   Globulin, Total 2.6 1.5 - 4.5 g/dL   Albumin/Globulin Ratio 1.7 1.2 - 2.2   Bilirubin Total 0.3 0.0 - 1.2 mg/dL   Alkaline Phosphatase 62 39 - 117 IU/L   AST 18 0 - 40 IU/L   ALT 26 0 - 32 IU/L      Assessment & Plan:   1. Dysmenorrhea - Norethindrone-Ethinyl Estradiol-Fe Biphas (LO LOESTRIN FE) 1 MG-10 MCG / 10 MCG tablet; Take 1 tablet by mouth daily.  Dispense: 1 Package; Refill: 11 - CBC with Differential/Platelet - CMP14+EGFR   Continue all other maintenance medications as listed above.  Follow up plan: No follow-ups on file.  Educational handout given for [place] PA-C [facility] [address]  [place], [place] [address] [phone]   10/11/2017, 8:56 PM

## 2017-10-12 ENCOUNTER — Other Ambulatory Visit: Payer: Self-pay

## 2017-10-12 ENCOUNTER — Telehealth: Payer: Self-pay | Admitting: Physician Assistant

## 2017-10-12 DIAGNOSIS — N946 Dysmenorrhea, unspecified: Secondary | ICD-10-CM

## 2017-10-12 MED ORDER — NORETHIN-ETH ESTRAD-FE BIPHAS 1 MG-10 MCG / 10 MCG PO TABS
1.0000 | ORAL_TABLET | Freq: Every day | ORAL | 11 refills | Status: DC
Start: 2017-10-12 — End: 2017-10-23

## 2017-10-12 NOTE — Telephone Encounter (Signed)
Patient aware of results.

## 2017-10-12 NOTE — Telephone Encounter (Signed)
All normal.

## 2017-10-12 NOTE — Telephone Encounter (Signed)
Patient sates she needed her BC to be re sent to [facility] re sent.

## 2017-10-15 ENCOUNTER — Other Ambulatory Visit: Payer: Self-pay | Admitting: Physician Assistant

## 2017-10-15 NOTE — Telephone Encounter (Signed)
Patient states she forgot to ask for a refill on concerta 36. Patient has not had for several years. Please advise

## 2017-10-15 NOTE — Telephone Encounter (Signed)
What is the name of the medication? Jessica Wu -- Patient  Had appt 3-20 for new patient and didn't let [name] know she needed it.  Have you contacted your pharmacy to request a refill? NO  Which pharmacy would you like this sent to? [facility] in [place]   Patient notified that their request is being sent to the clinical staff for review and that they should receive a call once it is complete. If they do not receive a call within 24 hours they can check with their pharmacy or our office.

## 2017-10-15 NOTE — Telephone Encounter (Signed)
Patient aware.

## 2017-10-15 NOTE — Telephone Encounter (Signed)
Will need records/evaluation for ADD/ADHD and an appointment. No ADD med is given outside of an office visit.

## 2017-10-23 ENCOUNTER — Other Ambulatory Visit: Payer: Self-pay | Admitting: Physician Assistant

## 2017-10-23 ENCOUNTER — Telehealth: Payer: Self-pay | Admitting: Physician Assistant

## 2017-10-23 MED ORDER — NORETHINDRONE 0.35 MG PO TABS: 1.0000 | ORAL_TABLET | Freq: Every day | ORAL | 11 refills | Status: DC

## 2017-10-23 NOTE — Telephone Encounter (Signed)
Per pharmacist Lo lo estrin in over $150  Please change to something cheaper

## 2017-10-23 NOTE — Progress Notes (Signed)
nor

## 2017-10-23 NOTE — Telephone Encounter (Signed)
Pt notified of RX 

## 2017-10-23 NOTE — Telephone Encounter (Signed)
Sent norethidrone to pharmacy

## 2017-12-12 ENCOUNTER — Encounter: Payer: Self-pay | Admitting: Physician Assistant

## 2017-12-12 ENCOUNTER — Ambulatory Visit: Payer: BLUE CROSS/BLUE SHIELD | Admitting: Physician Assistant

## 2017-12-12 VITALS — BP 117/75 | HR 95 | Temp 98.7°F | Ht 64.5 in | Wt 167.8 lb

## 2017-12-12 DIAGNOSIS — L237 Allergic contact dermatitis due to plants, except food: Secondary | ICD-10-CM | POA: Diagnosis not present

## 2017-12-12 MED ORDER — METHYLPREDNISOLONE ACETATE 80 MG/ML IJ SUSP
80.0000 mg | Freq: Once | INTRAMUSCULAR | Status: AC
  Administered 2017-12-12: 80 mg via INTRAMUSCULAR

## 2017-12-12 NOTE — Progress Notes (Signed)
BP 117/75   Pulse 95   Temp 98.7 F (37.1 C) (Oral)   Ht 5' 4.5" (1.638 m)   Wt 167 lb 12.8 oz (76.1 kg)   BMI 28.36 kg/m    Subjective:    Patient ID: Jessica Wu, female    DOB: 08-29-1995, 22 y.o.   MRN: [id number]  HPI: Jessica Wu is a 22 y.o. female presenting on 12/12/2017 for [facility]  Patient comes in after several days of increasing numbers of bumps on her legs and side of her face.  They have been increasing on her face.  There is significant itching and particularly in the left ear.  She was outside this weekend and knows that is probably where she was exposed to some poison oak or poison ivy.  She has been trying over-the-counter medications without much relief.  Past Medical History:  Diagnosis Date  . Bunion of right foot 06/2017   Relevant past medical, surgical, family and social history reviewed and updated as indicated. Interim medical history since our last visit reviewed. Allergies and medications reviewed and updated. DATA REVIEWED: CHART IN EPIC  Family History reviewed for pertinent findings.  Review of Systems  Constitutional: Negative.  Negative for activity change, fatigue and fever.  HENT: Negative.   Eyes: Negative.   Respiratory: Negative.  Negative for cough.   Cardiovascular: Negative.  Negative for chest pain.  Gastrointestinal: Negative.  Negative for abdominal pain.  Endocrine: Negative.   Genitourinary: Negative.  Negative for dysuria.  Musculoskeletal: Negative.   Skin: Positive for rash.  Neurological: Negative.     Allergies as of 12/12/2017      Reactions   Soap Other (See Comments)   SCENTED SOAPS/LOTIONS - SKIN IRRITATION      Medication List        Accurate as of 12/12/17  9:58 AM. Always use your most recent med list.          norethindrone 0.35 MG tablet Commonly known as:  MICRONOR,CAMILA,ERRIN Take 1 tablet (0.35 mg total) by mouth daily.          Objective:    BP 117/75   Pulse 95   Temp  98.7 F (37.1 C) (Oral)   Ht 5' 4.5" (1.638 m)   Wt 167 lb 12.8 oz (76.1 kg)   BMI 28.36 kg/m   Allergies  Allergen Reactions  . Soap Other (See Comments)    SCENTED SOAPS/LOTIONS - SKIN IRRITATION    Wt Readings from Last 3 Encounters:  12/12/17 167 lb 12.8 oz (76.1 kg)  10/10/17 157 lb (71.2 kg)  07/05/17 149 lb (67.6 kg) (79 %, Z= 0.81)*   * Growth percentiles are based on CDC (Girls, 2-20 Years) data.    Physical Exam  Constitutional: She is oriented to person, place, and time. She appears well-developed and well-nourished.  HENT:  Head: Normocephalic and atraumatic.  Eyes: Pupils are equal, round, and reactive to light. Conjunctivae and EOM are normal.  Cardiovascular: Normal rate, regular rhythm, normal heart sounds and intact distal pulses.  Pulmonary/Chest: Effort normal and breath sounds normal.  Abdominal: Soft. Bowel sounds are normal.  Neurological: She is alert and oriented to person, place, and time. She has normal reflexes.  Skin: Skin is warm and dry. Rash noted. Rash is papular. There is erythema.     Psychiatric: She has a normal mood and affect. Her behavior is normal. Judgment and thought content normal.        Assessment & Plan:  1. Allergic contact dermatitis due to plants, except food - methylPREDNISolone acetate (DEPO-MEDROL) injection 80 mg   Continue all other maintenance medications as listed above.  Follow up plan: Return for ask for [facility] records.  Educational handout given for survey  Remus Loffler PA-C [facility] [address]  [place], [place] [address] [phone]   12/12/2017, 9:58 AM

## 2019-01-08 ENCOUNTER — Other Ambulatory Visit: Payer: Self-pay

## 2019-01-09 ENCOUNTER — Ambulatory Visit (INDEPENDENT_AMBULATORY_CARE_PROVIDER_SITE_OTHER): Payer: PRIVATE HEALTH INSURANCE | Admitting: Physician Assistant

## 2019-01-09 ENCOUNTER — Ambulatory Visit (INDEPENDENT_AMBULATORY_CARE_PROVIDER_SITE_OTHER): Payer: PRIVATE HEALTH INSURANCE

## 2019-01-09 ENCOUNTER — Encounter: Payer: Self-pay | Admitting: Physician Assistant

## 2019-01-09 VITALS — BP 127/83 | HR 84 | Temp 97.9°F | Ht 64.5 in | Wt 176.6 lb

## 2019-01-09 DIAGNOSIS — M542 Cervicalgia: Secondary | ICD-10-CM | POA: Diagnosis not present

## 2019-01-09 DIAGNOSIS — M545 Low back pain, unspecified: Secondary | ICD-10-CM

## 2019-01-09 MED ORDER — NAPROXEN 500 MG PO TABS: 500.0000 mg | ORAL_TABLET | Freq: Two times a day (BID) | ORAL | 1 refills | Status: DC

## 2019-01-09 MED ORDER — CYCLOBENZAPRINE HCL 10 MG PO TABS: 10.0000 mg | ORAL_TABLET | Freq: Three times a day (TID) | ORAL | 0 refills | Status: DC | PRN

## 2019-01-09 MED ORDER — LO LOESTRIN FE 1 MG-10 MCG / 10 MCG PO TABS: 1.0000 | ORAL_TABLET | Freq: Every day | ORAL | 11 refills | Status: DC
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
## 2019-01-09 NOTE — Patient Instructions (Signed)
Lumbosacral Strain Lumbosacral strain is an injury that causes pain in the lower back (lumbosacral spine). This injury usually occurs from overstretching the muscles or ligaments along your spine. A strain can affect one or more muscles or cord-like tissues that connect bones to other bones (ligaments). What are the causes? This condition may be caused by:  A hard, direct hit (blow) to the back.  Excessive stretching of the lower back muscles. This may result from: ? A fall. ? Lifting something heavy. ? Repetitive movements such as bending or crouching. What increases the risk? The following factors may increase your risk of getting this condition:  Participating in sports or activities that involve: ? A sudden twist of the back. ? Pushing or pulling motions.  Being overweight or obese.  Having poor strength and flexibility, especially tight hamstrings or weak muscles in the back or abdomen.  Having too much of a curve in the lower back.  Having a pelvis that is tilted forward. What are the signs or symptoms? The main symptom of this condition is pain in the lower back, at the site of the strain. Pain may extend (radiate) down one or both legs. How is this diagnosed? This condition is diagnosed based on:  Your symptoms.  Your medical history.  A physical exam. ? Your health care provider may push on certain areas of your back to determine the source of your pain. ? You may be asked to bend forward, backward, and side to side to assess the severity of your pain and your range of motion.  Imaging tests, such as: ? X-rays. ? MRI.  How is this treated? Treatment for this condition may include:  Putting heat and cold on the affected area.  Medicines to help relieve pain and relax your muscles (muscle relaxants).  NSAIDs to help reduce swelling and discomfort. When your symptoms improve, it is important to gradually return to your normal routine as soon as possible to  reduce pain, avoid stiffness, and avoid loss of muscle strength. Generally, symptoms should improve within 6 weeks of treatment. However, recovery time varies. Follow these instructions at home: Managing pain, stiffness, and swelling   If directed, put ice on the injured area during the first 24 hours after your strain. ? Put ice in a plastic bag. ? Place a towel between your skin and the bag. ? Leave the ice on for 20 minutes, 2-3 times a day.  If directed, put heat on the affected area as often as told by your health care provider. Use the heat source that your health care provider recommends, such as a moist heat pack or a heating pad. ? Place a towel between your skin and the heat source. ? Leave the heat on for 20-30 minutes. ? Remove the heat if your skin turns bright red. This is especially important if you are unable to feel pain, heat, or cold. You may have a greater risk of getting burned. Activity  Rest and return to your normal activities as told by your health care provider. Ask your health care provider what activities are safe for you.  Avoid activities that take a lot of energy for as long as told by your health care provider. General instructions  Take over-the-counter and prescription medicines only as told by your health care provider.  Donot drive or use heavy machinery while taking prescription pain medicine.  Do not use any products that contain nicotine or tobacco, such as cigarettes and e-cigarettes. If you need  help quitting, ask your health care provider.  Keep all follow-up visits as told by your health care provider. This is important. How is this prevented?  Use correct form when playing sports and lifting heavy objects.  Use good posture when sitting and standing.  Maintain a healthy weight.  Sleep on a mattress with medium firmness to support your back.  Be safe and responsible while being active to avoid falls.  Do at least 150 minutes of  moderate-intensity exercise each week, such as brisk walking or water aerobics. Try a form of exercise that takes stress off your back, such as swimming or stationary cycling.  Maintain physical fitness, including: ? Strength. ? Flexibility. ? Cardiovascular fitness. ? Endurance. Contact a health care provider if:  Your back pain does not improve after 6 weeks of treatment.  Your symptoms get worse. Get help right away if:  Your back pain is severe.  You cannot stand or walk.  You have difficulty controlling when you urinate or when you have a bowel movement.  You feel nauseous or you vomit.  Your feet get very cold.  You have numbness, tingling, weakness, or problems using your arms or legs.  You develop any of the following: ? Shortness of breath. ? Dizziness. ? Pain in your legs. ? Weakness in your buttocks or legs. ? Discoloration of the skin on your toes or legs. This information is not intended to replace advice given to you by your health care provider. Make sure you discuss any questions you have with your health care provider. Document Released: 04/19/2005 Document Revised: 01/28/2016 Document Reviewed: 12/12/2015 Elsevier Interactive Patient Education  2019 [employer] Cervical Sprain  A cervical sprain is a stretch or tear in the tissues that connect bones (ligaments) in the neck. Most neck (cervical) sprains get better in 4-6 weeks. Follow these instructions at home: If you have a neck collar:  Wear it as told by your doctor. Do not take off (do not remove) the collar unless your doctor says that this is safe.  Ask your doctor before adjusting your collar.  If you have long hair, keep it outside of the collar.  Ask your doctor if you may take off the collar for cleaning and bathing. If you may take off the collar: ? Follow instructions from your doctor about how to take off the collar safely. ? Clean the collar by wiping it with mild soap and water. Let  it air-dry all the way. ? If your collar has removable pads:  Take the pads out every 1-2 days.  Hand wash the pads with soap and water.  Let the pads air-dry all the way before you put them back in the collar. Do not dry them in a clothes dryer. Do not dry them with a hair dryer. ? Check your skin under the collar for irritation or sores. If you see any, tell your doctor. Managing pain, stiffness, and swelling   Use a cervical traction device, if told by your doctor.  If told, put heat on the affected area. Do this before exercises (physical therapy) or as often as told by your doctor. Use the heat source that your doctor recommends, such as a moist heat pack or a heating pad. ? Place a towel between your skin and the heat source. ? Leave the heat on for 20-30 minutes. ? Take the heat off (remove the heat) if your skin turns bright red. This is very important if you cannot feel pain,  heat, or cold. You may have a greater risk of getting burned.  Put ice on the affected area. ? Put ice in a plastic bag. ? Place a towel between your skin and the bag. ? Leave the ice on for 20 minutes, 2-3 times a day. Activity  Do not drive while wearing a neck collar. If you do not have a neck collar, ask your doctor if it is safe to drive.  Do not drive or use heavy machinery while taking prescription pain medicine or muscle relaxants, unless your doctor approves.  Do not lift anything that is heavier than 10 lb (4.5 kg) until your doctor tells you that it is safe.  Rest as told by your doctor.  Avoid activities that make you feel worse. Ask your doctor what activities are safe for you.  Do exercises as told by your doctor or physical therapist. Preventing neck sprain  Practice good posture. Adjust your workstation to help with this, if needed.  Exercise regularly as told by your doctor or physical therapist.  Avoid activities that are risky or may cause a neck sprain (cervical sprain).  General instructions  Take over-the-counter and prescription medicines only as told by your doctor.  Do not use any products that contain nicotine or tobacco. This includes cigarettes and e-cigarettes. If you need help quitting, ask your doctor.  Keep all follow-up visits as told by your doctor. This is important. Contact a doctor if:  You have pain or other symptoms that get worse.  You have symptoms that do not get better after 2 weeks.  You have pain that does not get better with medicine.  You start to have new, unexplained symptoms.  You have sores or irritated skin from wearing your neck collar. Get help right away if:  You have very bad pain.  You have any of the following in any part of your body: ? Loss of feeling (numbness). ? Tingling. ? Weakness.  You cannot move a part of your body (you have paralysis).  Your activity level does not improve. Summary  A cervical sprain is a stretch or tear in the tissues that connect bones (ligaments) in the neck.  If you have a neck (cervical) collar, do not take off the collar unless your doctor says that this is safe.  Put ice on affected areas as told by your doctor.  Put heat on affected areas as told by your doctor.  Good posture and regular exercise can help prevent a neck sprain from happening again. This information is not intended to replace advice given to you by your health care provider. Make sure you discuss any questions you have with your health care provider. Document Released: 12/27/2007 Document Revised: 03/21/2016 Document Reviewed: 03/21/2016 Elsevier Interactive Patient Education  2019 [employer].

## 2019-01-10 ENCOUNTER — Encounter: Payer: Self-pay | Admitting: *Deleted

## 2019-03-28 ENCOUNTER — Emergency Department (HOSPITAL_COMMUNITY)
Admission: EM | Admit: 2019-03-28 | Discharge: 2019-03-29 | Disposition: A | Discharge status: Home / Self Care | Payer: BC Managed Care – PPO | Attending: Emergency Medicine | Admitting: Emergency Medicine

## 2019-03-28 ENCOUNTER — Encounter (HOSPITAL_COMMUNITY): Payer: Self-pay

## 2019-03-28 ENCOUNTER — Emergency Department (HOSPITAL_COMMUNITY): Payer: BC Managed Care – PPO

## 2019-03-28 ENCOUNTER — Other Ambulatory Visit: Payer: Self-pay

## 2019-03-28 DIAGNOSIS — K529 Noninfective gastroenteritis and colitis, unspecified: Secondary | ICD-10-CM | POA: Insufficient documentation

## 2019-03-28 DIAGNOSIS — K625 Hemorrhage of anus and rectum: Secondary | ICD-10-CM | POA: Diagnosis present

## 2019-03-28 DIAGNOSIS — R1084 Generalized abdominal pain: Secondary | ICD-10-CM

## 2019-03-28 LAB — URINALYSIS, ROUTINE W REFLEX MICROSCOPIC
Bilirubin Urine: NEGATIVE
Glucose, UA: NEGATIVE mg/dL
Hgb urine dipstick: NEGATIVE
Ketones, ur: 5 mg/dL — AB
Leukocytes,Ua: NEGATIVE
Nitrite: NEGATIVE
Protein, ur: NEGATIVE mg/dL
Specific Gravity, Urine: 1.02 (ref 1.005–1.030)
pH: 6 (ref 5.0–8.0)

## 2019-03-28 LAB — CBC
HCT: 43.9 % (ref 36.0–46.0)
Hemoglobin: 13.9 g/dL (ref 12.0–15.0)
MCH: 30.2 pg (ref 26.0–34.0)
MCHC: 31.7 g/dL (ref 30.0–36.0)
MCV: 95.4 fL (ref 80.0–100.0)
Platelets: 310 10*3/uL (ref 150–400)
RBC: 4.6 MIL/uL (ref 3.87–5.11)
RDW: 12.3 % (ref 11.5–15.5)
WBC: 10.6 10*3/uL — ABNORMAL HIGH (ref 4.0–10.5)
nRBC: 0 % (ref 0.0–0.2)

## 2019-03-28 LAB — COMPREHENSIVE METABOLIC PANEL
ALT: 19 U/L (ref 0–44)
AST: 15 U/L (ref 15–41)
Albumin: 4.2 g/dL (ref 3.5–5.0)
Alkaline Phosphatase: 81 U/L (ref 38–126)
Anion gap: 10 (ref 5–15)
BUN: 13 mg/dL (ref 6–20)
CO2: 25 mmol/L (ref 22–32)
Calcium: 9.2 mg/dL (ref 8.9–10.3)
Chloride: 104 mmol/L (ref 98–111)
Creatinine, Ser: 0.74 mg/dL (ref 0.44–1.00)
GFR calc Af Amer: >60 mL/min (ref >60)
GFR calc non Af Amer: >60 mL/min (ref >60)
Glucose, Bld: 98 mg/dL (ref 70–99)
Potassium: 4.2 mmol/L (ref 3.5–5.1)
Sodium: 139 mmol/L (ref 135–145)
Total Bilirubin: 0.8 mg/dL (ref 0.3–1.2)
Total Protein: 8 g/dL (ref 6.5–8.1)

## 2019-03-28 LAB — TYPE AND SCREEN
ABO/RH(D): A POS
Antibody Screen: NEGATIVE

## 2019-03-28 LAB — HCG, QUANTITATIVE, PREGNANCY: hCG, Beta Chain, Quant, S: <1 m[IU]/mL (ref <5)

## 2019-03-28 LAB — POC OCCULT BLOOD, ED: Fecal Occult Bld: NEGATIVE

## 2019-03-28 MED ORDER — IOHEXOL 300 MG/ML  SOLN
100.0000 mL | Freq: Once | INTRAMUSCULAR | Status: AC | PRN
  Administered 2019-03-28: 100 mL via INTRAVENOUS

## 2019-03-28 MED ORDER — ONDANSETRON HCL 4 MG/2ML IJ SOLN
4.0000 mg | Freq: Once | INTRAMUSCULAR | Status: AC
  Administered 2019-03-28: 4 mg via INTRAVENOUS
  Filled 2019-03-28: qty 2

## 2019-03-28 MED ORDER — KETOROLAC TROMETHAMINE 30 MG/ML IJ SOLN: 30.0000 mg | Freq: Once | INTRAMUSCULAR | Status: DC

## 2019-03-28 MED ORDER — CIPROFLOXACIN HCL 500 MG PO TABS: 500.0000 mg | ORAL_TABLET | Freq: Two times a day (BID) | ORAL | 0 refills | Status: AC

## 2019-03-28 MED ORDER — SODIUM CHLORIDE 0.9 % IV BOLUS
1000.0000 mL | Freq: Once | INTRAVENOUS | Status: AC
  Administered 2019-03-28: 1000 mL via INTRAVENOUS

## 2019-03-28 MED ORDER — MORPHINE SULFATE (PF) 4 MG/ML IV SOLN
4.0000 mg | Freq: Once | INTRAVENOUS | Status: AC
  Administered 2019-03-28: 4 mg via INTRAVENOUS
  Filled 2019-03-28: qty 1

## 2019-03-28 MED ORDER — METRONIDAZOLE 500 MG PO TABS: 500.0000 mg | ORAL_TABLET | Freq: Two times a day (BID) | ORAL | 0 refills | Status: AC

## 2019-03-28 NOTE — Discharge Instructions (Signed)
Tylenol for pain.   Follow up with your provider for recheck

## 2019-03-28 NOTE — ED Notes (Signed)
Pt ambulated in hallway with steady gait, denies dizziness at this time

## 2019-03-28 NOTE — ED Provider Notes (Signed)
[facility] EMERGENCY DEPARTMENT Provider Note   CSN: [id number] Arrival date & time: 03/28/19  1758     History   Chief Complaint Chief Complaint  Patient presents with  . Rectal Bleeding    HPI Thomasene LotKatlyn Broadwater is a 23 y.o. female who presents for evaluation of rectal bleeding that has been ongoing for the last 24 hours. She states she has had multiple episodes of bright blood in her stools and states that sometimes "it looks like tissue." She thinks it is coming from her rectum. She has not noted any vaginal bleeding or hematuria. She states she is also having some diffuse abdominal pain. Her pain started in the epigastric region and has since moved around to more generalized location. She has had some nausea but not vomiting. She did report an episode feeling lightheaded yesterday. She occasionally takes Naprosyn but does not take it on a regular basis. She does not smoke. She denies any fevers, CP, SOB, vaginal bleeding, vaginal discharge. She has not engaged in any anal intercourse.      The history is provided by the patient.    Past Medical History:  Diagnosis Date  . Bunion of right foot 06/2017    There are no active problems to display for this patient.   Past Surgical History:  Procedure Laterality Date  . METATARSAL OSTEOTOMY WITH BUNIONECTOMY Right 07/05/2017   Procedure: Right First Metatarsal Scarf Osteotomy and Modified Orpha BurMcBride Bunionectomy;  Surgeon: Toni ArthursHewitt, John, MD;  Location: [facility];  Service: Orthopedics;  Laterality: Right;  . NO PAST SURGERIES       OB History   No obstetric history on file.      Home Medications    Prior to Admission medications   Medication Sig Start Date End Date Taking? Authorizing Provider  naproxen (NAPROSYN) 500 MG tablet Take 1 tablet (500 mg total) by mouth 2 (two) times daily with a meal. Patient taking differently: Take 500 mg by mouth daily as needed for mild pain or moderate pain.  01/09/19  Yes  Remus LofflerJones, Angel S, PA-C    Family History Family History  Problem Relation Age of Onset  . Heart disease Mother   . Heart disease Maternal Grandfather     Social History Social History   Tobacco Use  . Smoking status: Never Smoker  . Smokeless tobacco: Never Used  Substance Use Topics  . Alcohol use: No  . Drug use: No     Allergies   Soap   Review of Systems Review of Systems  Constitutional: Negative for fever.  Respiratory: Negative for cough and shortness of breath.   Cardiovascular: Negative for chest pain.  Gastrointestinal: Positive for abdominal pain and blood in stool. Negative for nausea and vomiting.  Genitourinary: Negative for dysuria, hematuria, vaginal bleeding and vaginal discharge.  Neurological: Negative for light-headedness and headaches.  All other systems reviewed and are negative.    Physical Exam Updated Vital Signs BP 129/88 (BP Location: Left Arm)   Pulse 89   Temp 98.2 F (36.8 C) (Oral)   Resp 19   Ht 5\' 4"  (1.626 m)   Wt 79.4 kg   LMP 03/21/2019   SpO2 100%   BMI 30.04 kg/m   Physical Exam Vitals signs and nursing note reviewed. Exam conducted with a chaperone present.  Constitutional:      Appearance: Normal appearance. She is well-developed.  HENT:     Head: Normocephalic and atraumatic.  Eyes:     General: Lids  are normal.     Conjunctiva/sclera: Conjunctivae normal.     Pupils: Pupils are equal, round, and reactive to light.  Neck:     Musculoskeletal: Full passive range of motion without pain.  Cardiovascular:     Rate and Rhythm: Normal rate and regular rhythm.     Pulses: Normal pulses.     Heart sounds: Normal heart sounds. No murmur. No friction rub. No gallop.   Pulmonary:     Effort: Pulmonary effort is normal.     Breath sounds: Normal breath sounds.     Comments: Lungs clear to auscultation bilaterally.  Symmetric chest rise.  No wheezing, rales, rhonchi. Abdominal:     Palpations: Abdomen is soft. Abdomen  is not rigid.     Tenderness: There is generalized abdominal tenderness. There is no guarding.     Comments: Abdomen is soft, non-distended. Generalized tenderness noted. No rigidity, guarding. No McBurney's point tenderness. No CVA tenderness.   Genitourinary:    Rectum: Guaiac result negative.     Comments: The exam was performed with a chaperone present. Digital Rectal Exam reveals sphincter with good tone. No external hemorrhoids. No masses or fissures. Stool color is brown with no overt blood. Musculoskeletal: Normal range of motion.  Skin:    General: Skin is warm and dry.     Capillary Refill: Capillary refill takes less than 2 seconds.  Neurological:     Mental Status: She is alert and oriented to person, place, and time.  Psychiatric:        Speech: Speech normal.      ED Treatments / Results  Labs (all labs ordered are listed, but only abnormal results are displayed) Labs Reviewed  CBC - Abnormal; Notable for the following components:      Result Value   WBC 10.6 (*)    All other components within normal limits  URINALYSIS, ROUTINE W REFLEX MICROSCOPIC - Abnormal; Notable for the following components:   APPearance HAZY (*)    Ketones, ur 5 (*)    All other components within normal limits  COMPREHENSIVE METABOLIC PANEL  HCG, QUANTITATIVE, PREGNANCY  PREGNANCY, URINE  POC OCCULT BLOOD, ED  POC URINE PREG, ED  TYPE AND SCREEN    EKG None  Radiology No results found.  Procedures Procedures (including critical care time)  Medications Ordered in ED Medications  morphine 4 MG/ML injection 4 mg (has no administration in time range)  ondansetron (ZOFRAN) injection 4 mg (has no administration in time range)  sodium chloride 0.9 % bolus 1,000 mL (1,000 mLs Intravenous New Bag/Given 03/28/19 2107)     Initial Impression / Assessment and Plan / ED Course  I have reviewed the triage vital signs and the nursing notes.  Pertinent labs & imaging results that were  available during my care of the patient were reviewed by me and considered in my medical decision making (see chart for details).       23 y.o. F who presents for evaluation of rectal bleeding and abdominal pain x24 hours.  She reports episodes of bright red blood every bowel movement she has had.  Additionally she had had some epigastric abdominal pain and now report some diffuse abdominal pain.  Does report occasionally taking naproxen but does not have any other frequent NSAIDs, smoking.  No recent anal intercourse.  On initial ED arrival, she is afebrile, nontoxic appearing, sitting comfortably on exam.  She is slightly tachycardic and hypertensive.  Vitals otherwise stable.  On exam, she  has some generalized abdominal tenderness.  No rigidity, guarding.  Consider PUD versus colitis versus infectious process.  Also consider hemorrhoids given age.  We will plan to check labs, fecal occult.  Patient did show me a picture which looked like some urine with some red clot noted in the toilet.  I discussed with patient whether or not this is coming from her vagina but patient assures me that it is coming from her rectum.  CBC shows slight leukocytosis of 10.6. Hgb stable at 13.9. CMP is unremarkable. Fecal occult is negative.  UA is negative for any infectious etiology.  hCG is less than 1.  I discussed results with patient.  I discussed further treatment options in the ED, including pelvic exam for evaluation of any vaginal abnormality and CT abdomen pelvis.  We discussed at length regarding risk versus benefits of CT abdomen pelvis, including radiation exposure.  After extensive discussion, patient does not wish to have a pelvic exam at this time.  She does however want to proceed with CT abd/pelvis for further evaluation.  Patient signed to to Langston Masker, PA-C pending CT abd/pelvis.   Portions of this note were generated with Scientist, clinical (histocompatibility and immunogenetics). Dictation errors may occur despite best attempts  at proofreading.   Final Clinical Impressions(s) / ED Diagnoses   Final diagnoses:  Rectal bleeding  Generalized abdominal pain    ED Discharge Orders    None       Rosana Hoes 03/28/19 2206    Benjiman Core, MD 03/28/19 2259

## 2019-03-28 NOTE — ED Provider Notes (Signed)
Pt's care assumed at [facility] returned and shows colitis.  Pt given rx for flagyl and cipro.  Pt advised to follow up with her provider for recheck    Sidney Ace 03/28/19 2355    Davonna Belling, MD 03/31/19 [phone]

## 2019-03-28 NOTE — ED Triage Notes (Signed)
Pt states she is having rectal bleeding since 1400 yesterday. Pt states she had episode of abdominal pain yesterday and since she has had the bleeding which is bright red and mucous bleeding from rectum.

## 2019-06-04 ENCOUNTER — Encounter: Payer: Self-pay | Admitting: Physician Assistant

## 2019-06-04 ENCOUNTER — Ambulatory Visit (INDEPENDENT_AMBULATORY_CARE_PROVIDER_SITE_OTHER): Payer: PRIVATE HEALTH INSURANCE | Admitting: Physician Assistant

## 2019-06-04 DIAGNOSIS — K529 Noninfective gastroenteritis and colitis, unspecified: Secondary | ICD-10-CM

## 2019-06-04 NOTE — Progress Notes (Signed)
Tried to call the patient 4 times and did not ever get a response.  Terald Sleeper PA-C [facility] [address]  [place], [place] [address] [phone]

## 2019-06-06 ENCOUNTER — Ambulatory Visit (INDEPENDENT_AMBULATORY_CARE_PROVIDER_SITE_OTHER): Payer: PRIVATE HEALTH INSURANCE | Admitting: Physician Assistant

## 2019-06-06 ENCOUNTER — Encounter: Payer: Self-pay | Admitting: Physician Assistant

## 2019-06-06 DIAGNOSIS — K529 Noninfective gastroenteritis and colitis, unspecified: Secondary | ICD-10-CM

## 2019-06-06 DIAGNOSIS — E162 Hypoglycemia, unspecified: Secondary | ICD-10-CM

## 2019-06-06 DIAGNOSIS — R197 Diarrhea, unspecified: Secondary | ICD-10-CM | POA: Diagnosis not present

## 2019-06-13 ENCOUNTER — Encounter: Payer: Self-pay | Admitting: Physician Assistant

## 2019-06-13 NOTE — Progress Notes (Signed)
     Telephone visit  Subjective: ZO:XWRUEAV diarrhea PCP: Terald Sleeper, PA-C WUJ:Jessica Wu is a 23 y.o. female calls for telephone consult today. Patient provides verbal consent for consult held via phone.  Patient is identified with 2 separate identifiers.  At this time the entire area is on COVID-19 social distancing and stay home orders are in place.  Patient is of higher risk and therefore we are performing this by a virtual method.  Location of patient: home Location of provider: [facility] Others present for call: no  This patient is having a follow-up for her chronic bowel problems.  In September 4 she was seen to the emergency department with abdominal pain and abnormal bowels.  She has tried multiple medications for her diarrhea but nothing has helped.  She states that some days she has had as many as 20/day.  Now it is definitely every time she eats and then a few more times on top of that.  She did have blood back when she went to the emergency room but that is better now.  She would like to have gastro enterology evaluation.    ROS: Per HPI  Allergies  Allergen Reactions  . Contrast Media [Iodinated Diagnostic Agents]     Hive, anaphylaxis  . Soap Other (See Comments)    SCENTED SOAPS/LOTIONS - SKIN IRRITATION   Past Medical History:  Diagnosis Date  . Bunion of right foot 06/2017    Current Outpatient Medications:  .  naproxen (NAPROSYN) 500 MG tablet, Take 1 tablet (500 mg total) by mouth 2 (two) times daily with a meal. (Patient taking differently: Take 500 mg by mouth daily as needed for mild pain or moderate pain. ), Disp: 60 tablet, Rfl: 1  Assessment/ Plan: 23 y.o. female   1. Colitis - Ambulatory referral to Gastroenterology  2. Diarrhea, unspecified type - Ambulatory referral to Gastroenterology  3. Hypoglycemia - Ambulatory referral to Gastroenterology    No follow-ups on file.  Continue all other maintenance medications as listed above.   Start time: 11:42 AM End time: 11:53 AM  No orders of the defined types were placed in this encounter.   Particia Nearing PA-C [place] [phone]

## 2019-06-17 ENCOUNTER — Encounter: Payer: Self-pay | Admitting: Gastroenterology

## 2019-07-01 ENCOUNTER — Ambulatory Visit: Payer: BC Managed Care – PPO | Admitting: Gastroenterology

## 2019-07-02 ENCOUNTER — Encounter: Payer: Self-pay | Admitting: Physician Assistant

## 2019-07-02 ENCOUNTER — Ambulatory Visit (INDEPENDENT_AMBULATORY_CARE_PROVIDER_SITE_OTHER): Payer: PRIVATE HEALTH INSURANCE | Admitting: Physician Assistant

## 2019-07-02 DIAGNOSIS — R000 Tachycardia, unspecified: Secondary | ICD-10-CM | POA: Diagnosis not present

## 2019-07-02 DIAGNOSIS — Z8249 Family history of ischemic heart disease and other diseases of the circulatory system: Secondary | ICD-10-CM

## 2019-07-02 MED ORDER — METOPROLOL TARTRATE 25 MG PO TABS: ORAL_TABLET | ORAL | 0 refills | Status: DC

## 2019-07-02 NOTE — Progress Notes (Signed)
Telephone visit  Subjective: Jessica Wu PCP: Terald Sleeper, PA-C Jessica Wu is a 23 y.o. female calls for telephone consult today. Patient provides verbal consent for consult held via phone.  Patient is identified with 2 separate identifiers.  At this time the entire area is on COVID-19 social distancing and stay home orders are in place.  Patient is of higher risk and therefore we are performing this by a virtual method.  Location of patient: home Location of provider: HOME Others present for call: no  This patient is having a visit regarding episodes of tachycardia that she has been having over the last few months.  It has been increasing.  On November 25 she was at work and had to have EMS called.  They saw that her rhythm got as high as 160 273 bpm.  EKG did read ventricular tachycardia.  She knows that her mother had a rhythm issue and had to have a surgery performed but she could not remember exactly what the arrhythmia was and have asked her to get that information so that she can have it for the cardiologist will be able to have it when we send her to there.  When she had that spell she was shaking she thought it was related to her sugar so she ate and things did not improve the episode did last about 45 minutes.  She has had multiple episodes since then where she will have the palpitations and always the rate is high being of 150 or above.  But she has had some high rates when she checks them even without physical symptoms.  1 day this week she had had 120.  At this time we did make a referral to cardiology for soon as possible I Minna send in metoprolol 25mg  immediate release for her to take 1 at the episodes of the tachycardia, to not take it on a daily basis yet.  To keep up when the episodes occur.  If things do not get better and get much worse she needs to go to the emergency room.    ROS: Per HPI  Allergies  Allergen Reactions  . Contrast Media [Iodinated  Diagnostic Agents]     Hive, anaphylaxis  . Soap Other (See Comments)    SCENTED SOAPS/LOTIONS - SKIN IRRITATION   Past Medical History:  Diagnosis Date  . Bunion of right foot 06/2017    Current Outpatient Medications:  .  metoprolol tartrate (LOPRESSOR) 25 MG tablet, Take one tablet twice as needed for tachycardia, Disp: 60 tablet, Rfl: 0 .  naproxen (NAPROSYN) 500 MG tablet, Take 1 tablet (500 mg total) by mouth 2 (two) times daily with a meal. (Patient taking differently: Take 500 mg by mouth daily as needed for mild pain or moderate pain. ), Disp: 60 tablet, Rfl: 1  Assessment/ Plan: 23 y.o. female   1. Tachycardia - Ambulatory referral to Cardiology  2. Family history of supraventricular tachycardia - Ambulatory referral to Cardiology   No follow-ups on file.  Continue all other maintenance medications as listed above.  Start time: 3:35 PM End time: 3:51 PM  Meds ordered this encounter  Medications  . metoprolol tartrate (LOPRESSOR) 25 MG tablet    Sig: Take one tablet twice as needed for tachycardia    Dispense:  60 tablet    Refill:  0    Order Specific Question:   Supervising Provider    Answer:   Janora Norlander [[id number]]    OXBDZ  [place] [phone]

## 2019-07-02 NOTE — Patient Instructions (Signed)
Sinus Tachycardia  Sinus tachycardia is a kind of fast heartbeat. In sinus tachycardia, the heart beats more than 100 times a minute. Sinus tachycardia starts in a part of the heart called the sinus node. Sinus tachycardia may be harmless, or it may be a sign of a serious condition. What are the causes? This condition may be caused by:  Exercise or exertion.  A fever.  Pain.  Loss of body fluids (dehydration).  Severe bleeding (hemorrhage).  Anxiety and stress.  Certain substances, including: ? Alcohol. ? Caffeine. ? Tobacco and nicotine products. ? Cold medicines. ? Illegal drugs.  Medical conditions including: ? Heart disease. ? An infection. ? An overactive thyroid (hyperthyroidism). ? A lack of red blood cells (anemia). What are the signs or symptoms? Symptoms of this condition include:  A feeling that the heart is beating quickly (palpitations).  Suddenly noticing your heartbeat (cardiac awareness).  Dizziness.  Tiredness (fatigue).  Shortness of breath.  Chest pain.  Nausea.  Fainting. How is this diagnosed? This condition is diagnosed with:  A physical exam.  Other tests, such as: ? Blood tests. ? An electrocardiogram (ECG). This test measures the electrical activity of the heart. ? Ambulatory cardiac monitor. This records your heartbeats for 24 hours or more. You may be referred to a heart specialist (cardiologist). How is this treated? Treatment for this condition depends on the cause or the underlying condition. Treatment may involve:  Treating the underlying condition.  Taking new medicines or changing your current medicines as told by your health care provider.  Making changes to your diet or lifestyle. Follow these instructions at home: Lifestyle   Do not use any products that contain nicotine or tobacco, such as cigarettes and e-cigarettes. If you need help quitting, ask your health care provider.  Do not use illegal drugs, such as  cocaine.  Learn relaxation methods to help you when you get stressed or anxious. These include deep breathing.  Avoid caffeine or other stimulants. Alcohol use   Do not drink alcohol if: ? Your health care provider tells you not to drink. ? You are pregnant, may be pregnant, or are planning to become pregnant.  If you drink alcohol, limit how much you have: ? 0-1 drink a day for women. ? 0-2 drinks a day for men.  Be aware of how much alcohol is in your drink. In the [place] one drink equals one typical bottle of beer (12 oz), one-half glass of wine (5 oz), or one shot of hard liquor (1 oz). General instructions  Drink enough fluids to keep your urine pale yellow.  Take over-the-counter and prescription medicines only as told by your health care provider.  Keep all follow-up visits as told by your health care provider. This is important. Contact a health care provider if you have:  A fever.  Vomiting or diarrhea that does not go away. Get help right away if you:  Have pain in your chest, upper arms, jaw, or neck.  Become weak or dizzy.  Feel faint.  Have palpitations that do not go away. Summary  In sinus tachycardia, the heart beats more than 100 times a minute.  Sinus tachycardia may be harmless, or it may be a sign of a serious condition.  Treatment for this condition depends on the cause or the underlying condition.  Get help right away if you have pain in your chest, upper arms, jaw, or neck. This information is not intended to replace advice given to you by   your health care provider. Make sure you discuss any questions you have with your health care provider. Document Released: 08/17/2004 Document Revised: 08/29/2017 Document Reviewed: 08/29/2017 Elsevier Patient Education  2020 [employer]  

## 2019-07-29 ENCOUNTER — Encounter: Payer: Self-pay | Admitting: Gastroenterology

## 2019-07-29 ENCOUNTER — Telehealth: Payer: Self-pay

## 2019-07-29 ENCOUNTER — Other Ambulatory Visit: Payer: Self-pay

## 2019-07-29 ENCOUNTER — Ambulatory Visit (INDEPENDENT_AMBULATORY_CARE_PROVIDER_SITE_OTHER): Payer: Commercial Managed Care - PPO | Admitting: Gastroenterology

## 2019-07-29 VITALS — BP 132/83 | HR 102 | Temp 97.6°F | Ht 64.0 in | Wt 187.4 lb

## 2019-07-29 DIAGNOSIS — K219 Gastro-esophageal reflux disease without esophagitis: Secondary | ICD-10-CM | POA: Insufficient documentation

## 2019-07-29 DIAGNOSIS — K529 Noninfective gastroenteritis and colitis, unspecified: Secondary | ICD-10-CM | POA: Diagnosis not present

## 2019-07-29 DIAGNOSIS — K625 Hemorrhage of anus and rectum: Secondary | ICD-10-CM

## 2019-07-29 MED ORDER — DICYCLOMINE HCL 10 MG PO CAPS: 10.0000 mg | ORAL_CAPSULE | Freq: Three times a day (TID) | ORAL | 3 refills | Status: AC

## 2019-07-29 MED ORDER — PANTOPRAZOLE SODIUM 40 MG PO TBEC: 40.0000 mg | DELAYED_RELEASE_TABLET | Freq: Every day | ORAL | 3 refills | Status: AC

## 2019-07-29 MED ORDER — ONDANSETRON 4 MG PO TBDP: 4.0000 mg | ORAL_TABLET | Freq: Three times a day (TID) | ORAL | 3 refills | Status: AC | PRN

## 2019-07-29 MED ORDER — CLENPIQ 10-3.5-12 MG-GM -GM/160ML PO SOLN: 1.0000 | Freq: Once | ORAL | 0 refills | Status: AC

## 2019-07-29 NOTE — Progress Notes (Signed)
Primary Care Physician:  Jessica Sleeper, PA-C Primary Gastroenterologist:  Dr. Gala Wu  Chief Complaint  Patient presents with  . change in bowels    multiple loose stools during the day, no recent RB  . Abdominal Pain    comes/goes, right side-left side, varies    HPI:   Jessica Wu is a 24 y.o. female presenting today at the request of Particia Nearing, PA-C, due to diarrhea and colitis. Works as Public relations account executive. CT on file from Sept 2020 with mild wall thickening and surrounding inflammation involving descending and proximal sigmoid colon compatible with infectious or inflammatory colitis. Prior CT in 2018 normal. No anemia on CBC in Sept 2020. CMP normal. Heme negative in ED.   Chronic history of being sensitive to certain foods. Long-standing issues with what she had thought was IBS type symptoms. BMs alternate between soft to more frequent loose stools up to 4 times per day. No fever or chills.   Has reflux, bloating. Fatigued all the time. Intermittent abdominal pain lower abdomen similar to menstrual cramps, varies between right and left side. Sometimes gassy feeling/sharp pains postprandially.   Rectal bleeding in Sept 2020 with numerous loose stools/liquid, associated nausea. Went to ED. Noted rectal bleeding for 2-3 days. Placed on oral antibiotics by ED (metronidazole and cipro). No sick contacts in Sept 2020. Was taking Naproxen for back pain after accident but stopped due to abdominal pain in Sept 2020. Accident was in June 2020. No dysphagia. Will have nausea and burning in chest. Postprandial urgency. Fecal urgency sometimes not even related to eating. Decreased appetite and oral intake because of frequent stool.   Past Medical History:  Diagnosis Date  . Bunion of right foot 06/2017    Past Surgical History:  Procedure Laterality Date  . METATARSAL OSTEOTOMY WITH BUNIONECTOMY Right 07/05/2017   Procedure: Right First Metatarsal Scarf Osteotomy and Modified Alphonzo Grieve;   Surgeon: Jessica Simmer, MD;  Location: [facility];  Service: Orthopedics;  Laterality: Right;    Current Outpatient Medications  Medication Sig Dispense Refill  . dicyclomine (BENTYL) 10 MG capsule Take 1 capsule (10 mg total) by mouth 4 (four) times daily -  before meals and at bedtime. 120 capsule 3  . ondansetron (ZOFRAN ODT) 4 MG disintegrating tablet Take 1 tablet (4 mg total) by mouth every 8 (eight) hours as needed for nausea or vomiting. 30 tablet 3  . pantoprazole (PROTONIX) 40 MG tablet Take 1 tablet (40 mg total) by mouth daily. 30 minutes before breakfast 30 tablet 3   No current facility-administered medications for this visit.    Allergies as of 07/29/2019 - Review Complete 07/29/2019  Allergen Reaction Noted  . Contrast media [iodinated diagnostic agents]  06/06/2019  . Soap Other (See Comments) 06/29/2017    Family History  Problem Relation Age of Onset  . Heart disease Mother   . Irritable bowel syndrome Father   . Heart disease Maternal Grandfather   . Crohn's disease Neg Hx   . Ulcerative colitis Neg Hx   . Colon cancer Neg Hx   . Colon polyps Neg Hx     Social History   Socioeconomic History  . Marital status: Single    Spouse name: Not on file  . Number of children: Not on file  . Years of education: Not on file  . Highest education level: Not on file  Occupational History  . Not on file  Tobacco Use  . Smoking status: Never Smoker  .  Smokeless tobacco: Never Used  Substance and Sexual Activity  . Alcohol use: No  . Drug use: No  . Sexual activity: Yes  Other Topics Concern  . Not on file  Social History Narrative  . Not on file   Social Determinants of Health   Financial Resource Strain:   . Difficulty of Paying Living Expenses: Not on file  Food Insecurity:   . Worried About Programme researcher, broadcasting/film/video in the Last Year: Not on file  . Ran Out of Food in the Last Year: Not on file  Transportation Needs:   . Lack of Transportation  (Medical): Not on file  . Lack of Transportation (Non-Medical): Not on file  Physical Activity:   . Days of Exercise per Week: Not on file  . Minutes of Exercise per Session: Not on file  Stress:   . Feeling of Stress : Not on file  Social Connections:   . Frequency of Communication with Friends and Family: Not on file  . Frequency of Social Gatherings with Friends and Family: Not on file  . Attends Religious Services: Not on file  . Active Member of Clubs or Organizations: Not on file  . Attends Banker Meetings: Not on file  . Marital Status: Not on file  Intimate Partner Violence:   . Fear of Current or Ex-Partner: Not on file  . Emotionally Abused: Not on file  . Physically Abused: Not on file  . Sexually Abused: Not on file    Review of Systems: Gen: see HPI CV: Denies chest pain, heart palpitations, peripheral edema, syncope.  Resp: Denies shortness of breath at rest or with exertion. Denies wheezing or cough.  GI: see HPI GU : Denies urinary burning, urinary frequency, urinary hesitancy MS: Denies joint pain, muscle weakness, cramps, or limitation of movement.  Derm: Denies rash, itching, dry skin Psych: Denies depression, anxiety, memory loss, and confusion Heme: see HPI  Physical Exam: BP 132/83   Pulse (!) 102   Temp 97.6 F (36.4 C) (Oral)   Ht 5\' 4"  (1.626 m)   Wt 187 lb 6.4 oz (85 kg)   LMP 06/30/2019   BMI 32.17 kg/m  General:   Alert and oriented. Pleasant and cooperative. Well-nourished and well-developed.  Head:  Normocephalic and atraumatic. Eyes:  Without icterus, sclera clear and conjunctiva pink.  Lungs:  Clear to auscultation bilaterally. No wheezes, rales, or rhonchi. No distress.  Heart:  S1, S2 present without murmurs appreciated.  Abdomen:  +BS, soft, non-tender and non-distended. No HSM noted. No guarding or rebound. No masses appreciated.  Rectal:  Deferred  Msk:  Symmetrical without gross deformities. Normal  posture. Extremities:  Without edema. Neurologic:  Alert and  oriented x4;  grossly normal neurologically. Skin:  Intact without significant lesions or rashes. Psych:  Alert and cooperative. Normal mood and affect.  ASSESSMENT: Dacota Devall is a 24 y.o. female presenting today with long-standing history of alternating loose stool and soft BMs, abdominal cramping, with worsening in Sept 2020 and onset of rectal bleeding, presenting to the ED with findings of colitis on CT. Empirically treated with Cipro and Flagyl but without much improvement. No stool studies on file, but she is not having persistent diarrhea and alternates patterns. As of note, she had been taking NSAIDs prior to this and stopped after ED presentation. Will obtain stool studies if persistent diarrhea, otherwise provide supportive measures and arrange colonoscopy for diagnostic purposes.   GERD: start Protonix once daily. No alarm signs/symptoms.  PLAN:  Proceed with TCS with Dr. Rourk in near future using Propofol: the risks, benefits, and alternatives have been discussed with the patient in detail. The patient states understanding and desires to proceed.  Pregnancy screen prior  Start Protonix once daily  Bentyl up to QID before meals and at bedtime provided   Zofran for nausea  Avoid NSAIDs  Low fiber/lactose free diet for now   Jacquez Sheetz W. Maryan Sivak, PhD, ANP-BC [facility]   

## 2019-07-29 NOTE — Patient Instructions (Addendum)
We are arranging a colonoscopy with Dr. Jena Gauss in the near future.  For reflux: start taking Protonix once each day on an empty stomach at least 30 minutes before eating. I have also sent in Zofran for nausea to take every 8 hours as needed.  For abdominal cramping and more looser stool, you can take Bentyl up to 4 times a day. Monitor for constipation, dry mouth, dizziness.   If you have worsening symptoms to include frequent watery diarrhea that does not improve, please call us.   Continue to avoid NSAIDs as you are doing.  For now, follow a soft (low-fiber) diet and avoid dairy as much as possible.   Further recommendations to follow!   It was a pleasure to see you today. I want to create trusting relationships with patients to provide genuine, compassionate, and quality care. I value your feedback. If you receive a survey regarding your visit,  I greatly appreciate you taking time to fill this out.   Jessica Mink, PhD, ANP-BC [facility]    Low-Fiber Eating Plan Fiber is found in fruits, vegetables, whole grains, and beans. Eating a diet low in fiber helps to reduce how often you have bowel movements and how much you produce during a bowel movement. A low-fiber eating plan may help your digestive system heal if:  You have certain conditions, such as Crohn's disease or diverticulitis.  You recently had radiation therapy on your pelvis or bowel.  You recently had intestinal surgery.  You have a new surgical opening in your abdomen (colostomy or ileostomy).  Your intestine is narrowed (stricture). Your health care provider will determine how long you need to stay on this diet. Your health care provider may recommend that you work with a diet and nutrition specialist (dietitian). What are tips for following this plan? General guidelines  Follow recommendations from your dietitian about how much fiber you should have each day.  Most people on this eating plan  should try to eat less than 10 grams (g) of fiber each day. Your daily fiber goal is _________________ g.  Take vitamin and mineral supplements as told by your health care provider or dietitian. Chewable or liquid forms are best when on this eating plan. Reading food labels  Check food labels for the amount of dietary fiber.  Choose foods that have less than 2 grams of fiber in one serving. Cooking  Use white flour and other allowed grains for baking and cooking.  Cook meat using methods that keep it tender, such as braising or poaching.  Cook eggs until the yolk is completely solid.  Cook with healthy oils, such as olive oil or canola oil. Meal planning   Eat 5-6 small meals throughout the day instead of 3 large meals.  If you are lactose intolerant: ? Choose low-lactose dairy foods. ? Do not eat dairy foods, if told by your dietitian.  Limit fat and oils to less than 8 teaspoons a day.  Eat small portions of desserts. What foods are allowed? The items listed below may not be a complete list. Talk with your dietitian about what dietary choices are best for you. Grains All bread and crackers made with white flour. Waffles, pancakes, and [place] toast. Bagels. Pretzels. Melba toast, zwieback, and matzoh. Cooked and dried cereals that do not contain whole grains, added fiber, seeds, or dried fruit. CornmealDenzil Magn[place]on. Hot and cold cereals made with refined corn, wheat, rice, or oats. Plain pasta and noodles. White rice. Vegetables Well-cooked  or canned vegetables without skin, seeds, or stems. Cooked potatoes without skins. Vegetable juice. Fruits Soft-cooked or canned fruits without skin and seeds. Peeled ripe banana. Applesauce. Fruit juice without pulp. Meats and other protein foods Ground meat. Tender cuts of meat or poultry. Eggs. Fish, seafood, and shellfish. Smooth nut butters. Tofu. Dairy All milk products and drinks. Lactose-free milks, including rice, soy, and almond  milks. Yogurt without fruit, nuts, chocolate, or granola mix-ins. Sour cream. Cottage cheese. Cheese. Beverages Decaf coffee. Fruit and vegetable juices or smoothies (in small amounts, with no pulp or skins, and with fruits from allowed list). Sports drinks. Herbal tea. Fats and oils Olive oil, canola oil, sunflower oil, flaxseed oil, and grapeseed oil. Mayonnaise. Cream cheese. Margarine. Butter. Sweets and desserts Plain cakes and cookies. Cream pies and pies made with allowed fruits. Pudding. Custard. Fruit gelatin. Sherbet. Popsicles. Ice cream without nuts. Plain hard candy. Honey. Jelly. Molasses. Syrups, including chocolate syrup. Chocolate. Marshmallows. Gumdrops. Seasoning and other foods Bouillon. Broth. Cream soups made from allowed foods. Strained soup. Casseroles made with allowed foods. Ketchup. Mild mustard. Mild salad dressings. Plain gravies. Vinegar. Spices in moderation. Salt. Sugar. What foods are not allowed? The items listed below may not be a complete list. Talk with your dietitian about what dietary choices are best for you. Grains Whole wheat and whole grain breads and crackers. Multigrain breads and crackers. Rye bread. Whole grain or multigrain cereals. Cereals with nuts, raisins, or coconut. Bran. Coarse wheat cereals. Granola. High-fiber cereals. Cornmeal or corn bread. Whole grain pasta. Wild or brown rice. Quinoa. Popcorn. Buckwheat. Wheat germ. Vegetables Potato skins. Raw or undercooked vegetables. All beans and bean sprouts. Cooked greens. Corn. Peas. Cabbage. Beets. Broccoli. Brussels sprouts. Cauliflower. Mushrooms. Onions. Peppers. Parsnips. Okra. Sauerkraut. Fruit Raw or dried fruit. Berries. Fruit juice with pulp. Prune juice. Meats and other protein foods Tough, fibrous meats with gristle. Fatty meat. Poultry with skin. Fried meat, Environmental education officer, or fish. Deli or lunch meats. Sausage, bacon, and hot dogs. Nuts and chunky nut butter. Dried peas, beans, and  lentils. Dairy Yogurt with fruit, nuts, chocolate, or granola mix-ins. Beverages Caffeinated coffee and teas. Fats and oils Avocado. Coconut. Sweets and desserts Desserts, cookies, or candies that contain nuts or coconut. Dried fruit. Jams and preserves with seeds. Marmalade. Any dessert made with fruits or grains that are not allowed. Seasoning and other foods Corn tortilla chips. Soups made with vegetables or grains that are not allowed. Relish. Horseradish. Rosita Fire. Olives. Summary  Most people on a low-fiber eating plan should eat less than 10 grams of fiber a day. Follow recommendations from your dietitian about how much fiber you should have each day.  Always check food labels to see the dietary fiber content of packaged foods. In general, a low-fiber food will have fewer than 2 grams of fiber per serving.  In general, try to avoid whole grains, raw fruits and vegetables, dried fruit, tough cuts of meat, nuts, and seeds.  Take a vitamin and mineral supplement as told by your health care provider or dietitian. This information is not intended to replace advice given to you by your health care provider. Make sure you discuss any questions you have with your health care provider. Document Revised: 11/01/2018 Document Reviewed: 09/12/2016 Elsevier Patient Education  2020 Elsevier Inc.  Lactose-Free Diet, Adult If you have lactose intolerance, you are not able to digest lactose. Lactose is a natural sugar found mainly in dairy milk and dairy products. You may need to avoid  all foods and beverages that contain lactose. A lactose-free diet can help you do this. Which foods have lactose? Lactose is found in dairy milk and dairy products, such as:  Yogurt.  Cheese.  Butter.  Margarine.  Sour cream.  Cream.  Whipped toppings and nondairy creamers.  Ice cream and other dairy-based desserts. Lactose is also found in foods or products made with dairy milk or milk ingredients. To  find out whether a food contains dairy milk or a milk ingredient, look at the ingredients list. Avoid foods with the statement "May contain milk" and foods that contain:  Milk powder.  Whey.  Curd.  Caseinate.  Lactose.  Lactalbumin.  Lactoglobulin. What are alternatives to dairy milk and foods made with milk products?  Lactose-free milk.  Soy milk with added calcium and vitamin D.  Almond milk, coconut milk, rice milk, or other nondairy milk alternatives with added calcium and vitamin D. Note that these are low in protein.  Soy products, such as soy yogurt, soy cheese, soy ice cream, and soy-based sour cream.  Other nut milk products, such as almond yogurt, almond cheese, cashew yogurt, cashew cheese, cashew ice cream, coconut yogurt, and coconut ice cream. What are tips for following this plan?  Do not consume foods, beverages, vitamins, minerals, or medicines containing lactose. Read ingredient lists carefully.  Look for the words "lactose-free" on labels.  Use lactase enzyme drops or tablets as directed by your health care provider.  Use lactose-free milk or a milk alternative, such as soy milk or almond milk, for drinking and cooking.  Make sure you get enough calcium and vitamin D in your diet. A lactose-free eating plan can be lacking in these important nutrients.  Take calcium and vitamin D supplements as directed by your health care provider. Talk to your health care provider about supplements if you are not able to get enough calcium and vitamin D from food. What foods can I eat?  Fruits All fresh, canned, frozen, or dried fruits that are not processed with lactose. Vegetables All fresh, frozen, and canned vegetables without cheese, cream, or butter sauces. Grains Any that are not made with dairy milk or dairy products. Meats and other proteins Any meat, fish, poultry, and other protein sources that are not made with dairy milk or dairy products. Soy cheese  and yogurt. Fats and oils Any that are not made with dairy milk or dairy products. Beverages Lactose-free milk. Soy, rice, or almond milk with added calcium and vitamin D. Fruit and vegetable juices. Sweets and desserts Any that are not made with dairy milk or dairy products. Seasonings and condiments Any that are not made with dairy milk or dairy products. Calcium Calcium is found in many foods that contain lactose and is important for bone health. The amount of calcium you need depends on your age:  Adults younger than 50 years: 1,000 mg of calcium a day.  Adults older than 50 years: 1,200 mg of calcium a day. If you are not getting enough calcium, you may get it from other sources, including:  Orange juice with calcium added. There are 300-350 mg of calcium in 1 cup of orange juice.  Calcium-fortified soy milk. There are 300-400 mg of calcium in 1 cup of calcium-fortified soy milk.  Calcium-fortified rice or almond milk. There are 300 mg of calcium in 1 cup of calcium-fortified rice or almond milk.  Calcium-fortified breakfast cereals. There are 100-1,000 mg of calcium in calcium-fortified breakfast cereals.  Spinach, cooked.  There are 145 mg of calcium in  cup of cooked spinach.  Edamame, cooked. There are 130 mg of calcium in  cup of cooked edamame.  Collard greens, cooked. There are 125 mg of calcium in  cup of cooked collard greens.  Kale, frozen or cooked. There are 90 mg of calcium in  cup of cooked or frozen kale.  Almonds. There are 95 mg of calcium in  cup of almonds.  Broccoli, cooked. There are 60 mg of calcium in 1 cup of cooked broccoli. The items listed above may not be a complete list of recommended foods and beverages. Contact a dietitian for more options. What foods are not recommended? Fruits None, unless they are made with dairy milk or dairy products. Vegetables None, unless they are made with dairy milk or dairy products. Grains Any grains that  are made with dairy milk or dairy products. Meats and other proteins None, unless they are made with dairy milk or dairy products. Dairy All dairy products, including milk, goat's milk, buttermilk, kefir, acidophilus milk, flavored milk, evaporated milk, condensed milk, dulce de [place], eggnog, yogurt, cheese, and cheese spreads. Fats and oils Any that are made with milk or milk products. Margarines and salad dressings that contain milk or cheese. Cream. Half and half. Cream cheese. Sour cream. Chip dips made with sour cream or yogurt. Beverages Hot chocolate. Cocoa with lactose. Instant iced teas. Powdered fruit drinks. Smoothies made with dairy milk or yogurt. Sweets and desserts Any that are made with milk or milk products. Seasonings and condiments Chewing gum that has lactose. Spice blends if they contain lactose. Artificial sweeteners that contain lactose. Nondairy creamers. The items listed above may not be a complete list of foods and beverages to avoid. Contact a dietitian for more information. Summary  If you are lactose intolerant, it means that you have a hard time digesting lactose, a natural sugar found in milk and milk products.  Following a lactose-free diet can help you manage this condition.  Calcium is important for bone health and is found in many foods that contain lactose. Talk with your health care provider about other sources of calcium. This information is not intended to replace advice given to you by your health care provider. Make sure you discuss any questions you have with your health care provider. Document Revised: 08/07/2017 Document Reviewed: 08/07/2017 Elsevier Patient Education  2020 [employer].

## 2019-07-29 NOTE — H&P (View-Only) (Signed)
Primary Care Physician:  Terald Sleeper, PA-C Primary Gastroenterologist:  Dr. Gala Romney  Chief Complaint  Patient presents with  . change in bowels    multiple loose stools during the day, no recent RB  . Abdominal Pain    comes/goes, right side-left side, varies    HPI:   Jessica Wu is a 24 y.o. female presenting today at the request of Particia Nearing, PA-C, due to diarrhea and colitis. Works as Public relations account executive. CT on file from Sept 2020 with mild wall thickening and surrounding inflammation involving descending and proximal sigmoid colon compatible with infectious or inflammatory colitis. Prior CT in 2018 normal. No anemia on CBC in Sept 2020. CMP normal. Heme negative in ED.   Chronic history of being sensitive to certain foods. Long-standing issues with what she had thought was IBS type symptoms. BMs alternate between soft to more frequent loose stools up to 4 times per day. No fever or chills.   Has reflux, bloating. Fatigued all the time. Intermittent abdominal pain lower abdomen similar to menstrual cramps, varies between right and left side. Sometimes gassy feeling/sharp pains postprandially.   Rectal bleeding in Sept 2020 with numerous loose stools/liquid, associated nausea. Went to ED. Noted rectal bleeding for 2-3 days. Placed on oral antibiotics by ED (metronidazole and cipro). No sick contacts in Sept 2020. Was taking Naproxen for back pain after accident but stopped due to abdominal pain in Sept 2020. Accident was in June 2020. No dysphagia. Will have nausea and burning in chest. Postprandial urgency. Fecal urgency sometimes not even related to eating. Decreased appetite and oral intake because of frequent stool.   Past Medical History:  Diagnosis Date  . Bunion of right foot 06/2017    Past Surgical History:  Procedure Laterality Date  . METATARSAL OSTEOTOMY WITH BUNIONECTOMY Right 07/05/2017   Procedure: Right First Metatarsal Scarf Osteotomy and Modified Alphonzo Grieve;   Surgeon: Wylene Simmer, MD;  Location: [facility];  Service: Orthopedics;  Laterality: Right;    Current Outpatient Medications  Medication Sig Dispense Refill  . dicyclomine (BENTYL) 10 MG capsule Take 1 capsule (10 mg total) by mouth 4 (four) times daily -  before meals and at bedtime. 120 capsule 3  . ondansetron (ZOFRAN ODT) 4 MG disintegrating tablet Take 1 tablet (4 mg total) by mouth every 8 (eight) hours as needed for nausea or vomiting. 30 tablet 3  . pantoprazole (PROTONIX) 40 MG tablet Take 1 tablet (40 mg total) by mouth daily. 30 minutes before breakfast 30 tablet 3   No current facility-administered medications for this visit.    Allergies as of 07/29/2019 - Review Complete 07/29/2019  Allergen Reaction Noted  . Contrast media [iodinated diagnostic agents]  06/06/2019  . Soap Other (See Comments) 06/29/2017    Family History  Problem Relation Age of Onset  . Heart disease Mother   . Irritable bowel syndrome Father   . Heart disease Maternal Grandfather   . Crohn's disease Neg Hx   . Ulcerative colitis Neg Hx   . Colon cancer Neg Hx   . Colon polyps Neg Hx     Social History   Socioeconomic History  . Marital status: Single    Spouse name: Not on file  . Number of children: Not on file  . Years of education: Not on file  . Highest education level: Not on file  Occupational History  . Not on file  Tobacco Use  . Smoking status: Never Smoker  .  Smokeless tobacco: Never Used  Substance and Sexual Activity  . Alcohol use: No  . Drug use: No  . Sexual activity: Yes  Other Topics Concern  . Not on file  Social History Narrative  . Not on file   Social Determinants of Health   Financial Resource Strain:   . Difficulty of Paying Living Expenses: Not on file  Food Insecurity:   . Worried About Programme researcher, broadcasting/film/video in the Last Year: Not on file  . Ran Out of Food in the Last Year: Not on file  Transportation Needs:   . Lack of Transportation  (Medical): Not on file  . Lack of Transportation (Non-Medical): Not on file  Physical Activity:   . Days of Exercise per Week: Not on file  . Minutes of Exercise per Session: Not on file  Stress:   . Feeling of Stress : Not on file  Social Connections:   . Frequency of Communication with Friends and Family: Not on file  . Frequency of Social Gatherings with Friends and Family: Not on file  . Attends Religious Services: Not on file  . Active Member of Clubs or Organizations: Not on file  . Attends Banker Meetings: Not on file  . Marital Status: Not on file  Intimate Partner Violence:   . Fear of Current or Ex-Partner: Not on file  . Emotionally Abused: Not on file  . Physically Abused: Not on file  . Sexually Abused: Not on file    Review of Systems: Gen: see HPI CV: Denies chest pain, heart palpitations, peripheral edema, syncope.  Resp: Denies shortness of breath at rest or with exertion. Denies wheezing or cough.  GI: see HPI GU : Denies urinary burning, urinary frequency, urinary hesitancy MS: Denies joint pain, muscle weakness, cramps, or limitation of movement.  Derm: Denies rash, itching, dry skin Psych: Denies depression, anxiety, memory loss, and confusion Heme: see HPI  Physical Exam: BP 132/83   Pulse (!) 102   Temp 97.6 F (36.4 C) (Oral)   Ht 5\' 4"  (1.626 m)   Wt 187 lb 6.4 oz (85 kg)   LMP 06/30/2019   BMI 32.17 kg/m  General:   Alert and oriented. Pleasant and cooperative. Well-nourished and well-developed.  Head:  Normocephalic and atraumatic. Eyes:  Without icterus, sclera clear and conjunctiva pink.  Lungs:  Clear to auscultation bilaterally. No wheezes, rales, or rhonchi. No distress.  Heart:  S1, S2 present without murmurs appreciated.  Abdomen:  +BS, soft, non-tender and non-distended. No HSM noted. No guarding or rebound. No masses appreciated.  Rectal:  Deferred  Msk:  Symmetrical without gross deformities. Normal  posture. Extremities:  Without edema. Neurologic:  Alert and  oriented x4;  grossly normal neurologically. Skin:  Intact without significant lesions or rashes. Psych:  Alert and cooperative. Normal mood and affect.  ASSESSMENT: Jessica Wu is a 24 y.o. female presenting today with long-standing history of alternating loose stool and soft BMs, abdominal cramping, with worsening in Sept 2020 and onset of rectal bleeding, presenting to the ED with findings of colitis on CT. Empirically treated with Cipro and Flagyl but without much improvement. No stool studies on file, but she is not having persistent diarrhea and alternates patterns. As of note, she had been taking NSAIDs prior to this and stopped after ED presentation. Will obtain stool studies if persistent diarrhea, otherwise provide supportive measures and arrange colonoscopy for diagnostic purposes.   GERD: start Protonix once daily. No alarm signs/symptoms.  PLAN:  Proceed with TCS with Dr. Jena Gauss in near future using Propofol: the risks, benefits, and alternatives have been discussed with the patient in detail. The patient states understanding and desires to proceed.  Pregnancy screen prior  Start Protonix once daily  Bentyl up to QID before meals and at bedtime provided   Zofran for nausea  Avoid NSAIDs  Low fiber/lactose free diet for now   Gelene Mink, PhD, ANP-BC [facility]

## 2019-07-29 NOTE — Telephone Encounter (Signed)
COVID test and Pre-op appt 08/06/19. Called and informed pt of appts. Letter mailed.  No PA needed for TCS per [facility] website if in network.

## 2019-07-30 NOTE — Progress Notes (Signed)
Cc'ed to pcp °

## 2019-08-05 NOTE — Patient Instructions (Signed)
Jessica Wu  08/05/2019     @PREFPERIOPPHARMACY @   Your procedure is scheduled on  08/07/2019  Report to 08/09/2019 at  1215  P.M.  Call this number if you have problems the morning of surgery:  [phone]   Remember:  Follow the diet and prep instructions given to you by Dr [phone] office.                       Take these medicines the morning of surgery with A SIP OF WATER  Zofran(if needed), protonix.    Do not wear jewelry, make-up or nail polish.  Do not wear lotions, powders, or perfumes. Please wear deodorant and brush your teeth.  Do not shave 48 hours prior to surgery.  Men may shave face and neck.  Do not bring valuables to the hospital.  [facility] is not responsible for any belongings or valuables.  Contacts, dentures or bridgework may not be worn into surgery.  Leave your suitcase in the car.  After surgery it may be brought to your room.  For patients admitted to the hospital, discharge time will be determined by your treatment team.  Patients discharged the day of surgery will not be allowed to drive home.   Name and phone number of your driver:   Family Special instructions:  None  Please read over the following fact sheets that you were given. Anesthesia Post-op Instructions and Care and Recovery After Surgery       Colonoscopy, Adult, Care After This sheet gives you information about how to care for yourself after your procedure. Your health care provider may also give you more specific instructions. If you have problems or questions, contact your health care provider. What can I expect after the procedure? After the procedure, it is common to have:  A small amount of blood in your stool for 24 hours after the procedure.  Some gas.  Mild cramping or bloating of your abdomen. Follow these instructions at home: Eating and drinking   Drink enough fluid to keep your urine pale yellow.  Follow instructions from your health care  provider about eating or drinking restrictions.  Resume your normal diet as instructed by your health care provider. Avoid heavy or fried foods that are hard to digest. Activity  Rest as told by your health care provider.  Avoid sitting for a long time without moving. Get up to take short walks every 1-2 hours. This is important to improve blood flow and breathing. Ask for help if you feel weak or unsteady.  Return to your normal activities as told by your health care provider. Ask your health care provider what activities are safe for you. Managing cramping and bloating   Try walking around when you have cramps or feel bloated.  Apply heat to your abdomen as told by your health care provider. Use the heat source that your health care provider recommends, such as a moist heat pack or a heating pad. ? Place a towel between your skin and the heat source. ? Leave the heat on for 20-30 minutes. ? Remove the heat if your skin turns bright red. This is especially important if you are unable to feel pain, heat, or cold. You may have a greater risk of getting burned. General instructions  For the first 24 hours after the procedure: ? Do not drive or use machinery. ? Do not sign important documents. ? Do not drink  alcohol. ? Do your regular daily activities at a slower pace than normal. ? Eat soft foods that are easy to digest.  Take over-the-counter and prescription medicines only as told by your health care provider.  Keep all follow-up visits as told by your health care provider. This is important. Contact a health care provider if:  You have blood in your stool 2-3 days after the procedure. Get help right away if you have:  More than a small spotting of blood in your stool.  Large blood clots in your stool.  Swelling of your abdomen.  Nausea or vomiting.  A fever.  Increasing pain in your abdomen that is not relieved with medicine. Summary  After the procedure, it is  common to have a small amount of blood in your stool. You may also have mild cramping and bloating of your abdomen.  For the first 24 hours after the procedure, do not drive or use machinery, sign important documents, or drink alcohol.  Get help right away if you have a lot of blood in your stool, nausea or vomiting, a fever, or increased pain in your abdomen. This information is not intended to replace advice given to you by your health care provider. Make sure you discuss any questions you have with your health care provider. Document Revised: 02/03/2019 Document Reviewed: 02/03/2019 Elsevier Patient Education  2020 [employer] Monitored Anesthesia Care, Care After These instructions provide you with information about caring for yourself after your procedure. Your health care provider may also give you more specific instructions. Your treatment has been planned according to current medical practices, but problems sometimes occur. Call your health care provider if you have any problems or questions after your procedure. What can I expect after the procedure? After your procedure, you may:  Feel sleepy for several hours.  Feel clumsy and have poor balance for several hours.  Feel forgetful about what happened after the procedure.  Have poor judgment for several hours.  Feel nauseous or vomit.  Have a sore throat if you had a breathing tube during the procedure. Follow these instructions at home: For at least 24 hours after the procedure:      Have a responsible adult stay with you. It is important to have someone help care for you until you are awake and alert.  Rest as needed.  Do not: ? Participate in activities in which you could fall or become injured. ? Drive. ? Use heavy machinery. ? Drink alcohol. ? Take sleeping pills or medicines that cause drowsiness. ? Make important decisions or sign legal documents. ? Take care of children on your own. Eating and  drinking  Follow the diet that is recommended by your health care provider.  If you vomit, drink water, juice, or soup when you can drink without vomiting.  Make sure you have little or no nausea before eating solid foods. General instructions  Take over-the-counter and prescription medicines only as told by your health care provider.  If you have sleep apnea, surgery and certain medicines can increase your risk for breathing problems. Follow instructions from your health care provider about wearing your sleep device: ? Anytime you are sleeping, including during daytime naps. ? While taking prescription pain medicines, sleeping medicines, or medicines that make you drowsy.  If you smoke, do not smoke without supervision.  Keep all follow-up visits as told by your health care provider. This is important. Contact a health care provider if:  You keep feeling nauseous or  you keep vomiting.  You feel light-headed.  You develop a rash.  You have a fever. Get help right away if:  You have trouble breathing. Summary  For several hours after your procedure, you may feel sleepy and have poor judgment.  Have a responsible adult stay with you for at least 24 hours or until you are awake and alert. This information is not intended to replace advice given to you by your health care provider. Make sure you discuss any questions you have with your health care provider. Document Revised: 10/08/2017 Document Reviewed: 10/31/2015 Elsevier Patient Education  [place].

## 2019-08-06 ENCOUNTER — Other Ambulatory Visit: Payer: Self-pay | Admitting: Internal Medicine

## 2019-08-06 ENCOUNTER — Telehealth: Payer: Self-pay

## 2019-08-06 ENCOUNTER — Ambulatory Visit: Payer: BC Managed Care – PPO | Admitting: Cardiovascular Disease

## 2019-08-06 ENCOUNTER — Other Ambulatory Visit: Payer: Self-pay

## 2019-08-06 ENCOUNTER — Encounter (HOSPITAL_COMMUNITY): Payer: Self-pay

## 2019-08-06 ENCOUNTER — Other Ambulatory Visit (HOSPITAL_COMMUNITY)
Admission: RE | Admit: 2019-08-06 | Discharge: 2019-08-06 | Disposition: A | Discharge status: Home / Self Care | Payer: Commercial Managed Care - PPO | Source: Ambulatory Visit | Attending: Internal Medicine | Admitting: Internal Medicine

## 2019-08-06 ENCOUNTER — Encounter (HOSPITAL_COMMUNITY)
Admission: RE | Admit: 2019-08-06 | Discharge: 2019-08-06 | Disposition: A | Discharge status: Home / Self Care | Payer: Commercial Managed Care - PPO | Source: Ambulatory Visit | Attending: Internal Medicine | Admitting: Internal Medicine

## 2019-08-06 DIAGNOSIS — Z01812 Encounter for preprocedural laboratory examination: Secondary | ICD-10-CM | POA: Insufficient documentation

## 2019-08-06 HISTORY — DX: Gastro-esophageal reflux disease without esophagitis: K21.9

## 2019-08-06 LAB — SARS CORONAVIRUS 2 (TAT 6-24 HRS): SARS Coronavirus 2: NEGATIVE

## 2019-08-06 LAB — PREGNANCY, URINE: Preg Test, Ur: NEGATIVE

## 2019-08-06 NOTE — Telephone Encounter (Signed)
Chere at pre-service center called office and LMOVM. Stated pt needs PA for TCS scheduled for tomorrow.   PA for TCS submitted via [facility] website. Case ID# [id number].

## 2019-08-06 NOTE — Telephone Encounter (Signed)
Called [facility], spoke to [place] with care management. TCS approved. PA# [id number], valid 08/07/19-09/05/19.  Tried to call Chere back at pre-service center, no answer, LMOVM to inform her PA.

## 2019-08-07 ENCOUNTER — Ambulatory Visit (HOSPITAL_COMMUNITY): Payer: Commercial Managed Care - PPO | Admitting: Anesthesiology

## 2019-08-07 ENCOUNTER — Encounter (HOSPITAL_COMMUNITY)
Admission: RE | Disposition: A | Discharge status: Home / Self Care | Payer: Self-pay | Source: Home / Self Care | Attending: Internal Medicine

## 2019-08-07 ENCOUNTER — Ambulatory Visit (HOSPITAL_COMMUNITY)
Admission: RE | Admit: 2019-08-07 | Discharge: 2019-08-07 | Disposition: A | Discharge status: Home / Self Care | Payer: Commercial Managed Care - PPO | Attending: Internal Medicine | Admitting: Internal Medicine

## 2019-08-07 ENCOUNTER — Encounter (HOSPITAL_COMMUNITY): Payer: Self-pay | Admitting: Internal Medicine

## 2019-08-07 DIAGNOSIS — Z8711 Personal history of peptic ulcer disease: Secondary | ICD-10-CM | POA: Diagnosis not present

## 2019-08-07 DIAGNOSIS — R197 Diarrhea, unspecified: Secondary | ICD-10-CM | POA: Diagnosis present

## 2019-08-07 DIAGNOSIS — Z79899 Other long term (current) drug therapy: Secondary | ICD-10-CM | POA: Diagnosis not present

## 2019-08-07 DIAGNOSIS — R933 Abnormal findings on diagnostic imaging of other parts of digestive tract: Secondary | ICD-10-CM | POA: Diagnosis present

## 2019-08-07 DIAGNOSIS — K219 Gastro-esophageal reflux disease without esophagitis: Secondary | ICD-10-CM | POA: Diagnosis not present

## 2019-08-07 DIAGNOSIS — K921 Melena: Secondary | ICD-10-CM | POA: Insufficient documentation

## 2019-08-07 DIAGNOSIS — K625 Hemorrhage of anus and rectum: Secondary | ICD-10-CM

## 2019-08-07 HISTORY — PX: COLONOSCOPY WITH PROPOFOL: SHX5780

## 2019-08-07 HISTORY — DX: Tachycardia, unspecified: R00.0

## 2019-08-07 SURGERY — COLONOSCOPY WITH PROPOFOL
Anesthesia: General

## 2019-08-07 MED ORDER — PROPOFOL 10 MG/ML IV BOLUS
INTRAVENOUS | Status: AC
  Filled 2019-08-07: qty 20

## 2019-08-07 MED ORDER — HYDROMORPHONE HCL 1 MG/ML IJ SOLN: 0.2500 mg | INTRAMUSCULAR | Status: DC | PRN

## 2019-08-07 MED ORDER — MIDAZOLAM HCL 2 MG/2ML IJ SOLN
INTRAMUSCULAR | Status: AC
  Filled 2019-08-07: qty 2

## 2019-08-07 MED ORDER — PROPOFOL 500 MG/50ML IV EMUL
INTRAVENOUS | Status: DC | PRN
  Administered 2019-08-07: 150 ug/kg/min via INTRAVENOUS

## 2019-08-07 MED ORDER — MIDAZOLAM HCL 2 MG/2ML IJ SOLN
INTRAMUSCULAR | Status: DC | PRN
  Administered 2019-08-07 (×2): 1 mg via INTRAVENOUS

## 2019-08-07 MED ORDER — HYDROCODONE-ACETAMINOPHEN 7.5-325 MG PO TABS: 1.0000 | ORAL_TABLET | Freq: Once | ORAL | Status: DC | PRN

## 2019-08-07 MED ORDER — CHLORHEXIDINE GLUCONATE CLOTH 2 % EX PADS: 6.0000 | MEDICATED_PAD | Freq: Once | CUTANEOUS | Status: DC

## 2019-08-07 MED ORDER — PROMETHAZINE HCL 25 MG/ML IJ SOLN: 6.2500 mg | INTRAMUSCULAR | Status: DC | PRN

## 2019-08-07 MED ORDER — LIDOCAINE HCL (CARDIAC) PF 100 MG/5ML IV SOSY
PREFILLED_SYRINGE | INTRAVENOUS | Status: DC | PRN
  Administered 2019-08-07: 50 mg via INTRAVENOUS

## 2019-08-07 MED ORDER — MIDAZOLAM HCL 2 MG/2ML IJ SOLN: 0.5000 mg | Freq: Once | INTRAMUSCULAR | Status: DC | PRN

## 2019-08-07 MED ORDER — LIDOCAINE 2% (20 MG/ML) 5 ML SYRINGE
INTRAMUSCULAR | Status: AC
  Filled 2019-08-07: qty 10

## 2019-08-07 MED ORDER — LACTATED RINGERS IV SOLN: INTRAVENOUS | Status: DC

## 2019-08-07 MED ORDER — PROPOFOL 10 MG/ML IV BOLUS
INTRAVENOUS | Status: DC | PRN
  Administered 2019-08-07 (×6): 20 mg via INTRAVENOUS

## 2019-08-07 NOTE — Discharge Instructions (Signed)
Colonoscopy Discharge Instructions  Read the instructions outlined below and refer to this sheet in the next few weeks. These discharge instructions provide you with general information on caring for yourself after you leave the hospital. Your doctor may also give you specific instructions. While your treatment has been planned according to the most current medical practices available, unavoidable complications occasionally occur. If you have any problems or questions after discharge, call Dr. Jena Gauss at [phone]. ACTIVITY  You may resume your regular activity, but move at a slower pace for the next 24 hours.   Take frequent rest periods for the next 24 hours.   Walking will help get rid of the air and reduce the bloated feeling in your belly (abdomen).   No driving for 24 hours (because of the medicine (anesthesia) used during the test).    Do not sign any important legal documents or operate any machinery for 24 hours (because of the anesthesia used during the test).  NUTRITION  Drink plenty of fluids.   You may resume your normal diet as instructed by your doctor.   Begin with a light meal and progress to your normal diet. Heavy or fried foods are harder to digest and may make you feel sick to your stomach (nauseated).   Avoid alcoholic beverages for 24 hours or as instructed.  MEDICATIONS  You may resume your normal medications unless your doctor tells you otherwise.  WHAT YOU CAN EXPECT TODAY  Some feelings of bloating in the abdomen.   Passage of more gas than usual.   Spotting of blood in your stool or on the toilet paper.  IF YOU HAD POLYPS REMOVED DURING THE COLONOSCOPY:  No aspirin products for 7 days or as instructed.   No alcohol for 7 days or as instructed.   Eat a soft diet for the next 24 hours.  FINDING OUT THE RESULTS OF YOUR TEST Not all test results are available during your visit. If your test results are not back during the visit, make an appointment  with your caregiver to find out the results. Do not assume everything is normal if you have not heard from your caregiver or the medical facility. It is important for you to follow up on all of your test results.  SEEK IMMEDIATE MEDICAL ATTENTION IF:  You have more than a spotting of blood in your stool.   Your belly is swollen (abdominal distention).   You are nauseated or vomiting.   You have a temperature over 101.   You have abdominal pain or discomfort that is severe or gets worse throughout the day.    Your colonoscopy looked normal today.  Saw no evidence of ulcerative colitis or Crohn's disease  Biopsies taken.  Stool sample submitted to the lab  Further recommendations to follow pending review of pathology report  At patient request, I called Corrie Dandy at [phone] and reviewed results     Monitored Anesthesia Care, Care After These instructions provide you with information about caring for yourself after your procedure. Your health care provider may also give you more specific instructions. Your treatment has been planned according to current medical practices, but problems sometimes occur. Call your health care provider if you have any problems or questions after your procedure. What can I expect after the procedure? After your procedure, you may:  Feel sleepy for several hours.  Feel clumsy and have poor balance for several hours.  Feel forgetful about what happened after the procedure.  Have poor judgment  for several hours.  Feel nauseous or vomit.  Have a sore throat if you had a breathing tube during the procedure. Follow these instructions at home: For at least 24 hours after the procedure:      Have a responsible adult stay with you. It is important to have someone help care for you until you are awake and alert.  Rest as needed.  Do not: ? Participate in activities in which you could fall or become injured. ? Drive. ? Use heavy  machinery. ? Drink alcohol. ? Take sleeping pills or medicines that cause drowsiness. ? Make important decisions or sign legal documents. ? Take care of children on your own. Eating and drinking  Follow the diet that is recommended by your health care provider.  If you vomit, drink water, juice, or soup when you can drink without vomiting.  Make sure you have little or no nausea before eating solid foods. General instructions  Take over-the-counter and prescription medicines only as told by your health care provider.  If you have sleep apnea, surgery and certain medicines can increase your risk for breathing problems. Follow instructions from your health care provider about wearing your sleep device: ? Anytime you are sleeping, including during daytime naps. ? While taking prescription pain medicines, sleeping medicines, or medicines that make you drowsy.  If you smoke, do not smoke without supervision.  Keep all follow-up visits as told by your health care provider. This is important. Contact a health care provider if:  You keep feeling nauseous or you keep vomiting.  You feel light-headed.  You develop a rash.  You have a fever. Get help right away if:  You have trouble breathing. Summary  For several hours after your procedure, you may feel sleepy and have poor judgment.  Have a responsible adult stay with you for at least 24 hours or until you are awake and alert. This information is not intended to replace advice given to you by your health care provider. Make sure you discuss any questions you have with your health care provider. Document Revised: 10/08/2017 Document Reviewed: 10/31/2015 Elsevier Patient Education  [employer].

## 2019-08-07 NOTE — Anesthesia Postprocedure Evaluation (Signed)
Anesthesia Post Note  Patient: Jessica Wu  Procedure(s) Performed: COLONOSCOPY WITH PROPOFOL (N/A )  Patient location during evaluation: PACU Anesthesia Type: General Level of consciousness: awake and alert Pain management: pain level controlled Vital Signs Assessment: post-procedure vital signs reviewed and stable Respiratory status: spontaneous breathing Cardiovascular status: stable Postop Assessment: no apparent nausea or vomiting Anesthetic complications: no     Last Vitals:  Vitals:   08/07/19 1242 08/07/19 1408  BP: 127/83   Pulse: 95 88  Resp: 18 18  Temp: 36.7 C (!) 36.4 C  SpO2: 98% 100%    Last Pain:  Vitals:   08/07/19 1408  TempSrc:   PainSc: 0-No pain                 Edison Pace

## 2019-08-07 NOTE — Op Note (Signed)
[facility] Patient Name: Jessica Wu Procedure Date: 08/07/2019 1:18 PM MRN: [id number] Date of Birth: 05-21-1996 Attending MD: Norvel Richards , MD CSN: [id number] Age: 24 Admit Type: Outpatient Procedure:                Colonoscopy Indications:              Abnormal CT of the GI tract, bloody diarrhea Providers:                Norvel Richards, MD, Lurline Del, RN, Aram Candela Referring MD:              Medicines:                Propofol per Anesthesia Complications:            No immediate complications. Estimated Blood Loss:     Estimated blood loss: none. Procedure:                Pre-Anesthesia Assessment:                           - Prior to the procedure, a History and Physical                            was performed, and patient medications and                            allergies were reviewed. The patient's tolerance of                            previous anesthesia was also reviewed. The risks                            and benefits of the procedure and the sedation                            options and risks were discussed with the patient.                            All questions were answered, and informed consent                            was obtained. Prior Anticoagulants: The patient has                            taken no previous anticoagulant or antiplatelet                            agents. ASA Grade Assessment: II - A patient with                            mild systemic disease. After reviewing the risks  and benefits, the patient was deemed in                            satisfactory condition to undergo the procedure.                           After obtaining informed consent, the colonoscope                            was passed under direct vision. Throughout the                            procedure, the patient's blood pressure, pulse, and                            oxygen  saturations were monitored continuously. The                            CF-[id number] ([id number]) scope was introduced through                            the anus and advanced to the 8 cm into the ileum.                            The colonoscopy was performed without difficulty.                            The patient tolerated the procedure well. The                            quality of the bowel preparation was adequate. Scope In: 1:44:32 PM Scope Out: 1:56:49 PM Total Procedure Duration: 0 hours 12 minutes 17 seconds  Findings:      The perianal and digital rectal examinations were normal.      The colon (entire examined portion) appeared normal.      The retroflexed view of the distal rectum and anal verge was normal and       showed no anal or rectal abnormalities. Normal-appearing distal 8 cm of       TI. Stool sample submitted. Impression:               - The entire examined colon is normal.                           - The distal rectum and anal verge are normal on                            retroflexion view.                           -Stool sample collected. These findings are                            somewhat reassuring. Patient may well have had  infectious colitis which has resolved on top of                            baseline irritable bowel syndrome. Certainly, no                            findings consistent with Crohn's disease or                            ulcerative colitis Moderate Sedation:      Moderate (conscious) sedation was personally administered by an       anesthesia professional. The following parameters were monitored: oxygen       saturation, heart rate, blood pressure, respiratory rate, EKG, adequacy       of pulmonary ventilation, and response to care. Recommendation:           - Patient has a contact number available for                            emergencies. The signs and symptoms of potential                             delayed complications were discussed with the                            patient. Return to normal activities tomorrow.                            Written discharge instructions were provided to the                            patient.                           - Resume previous diet.                           - Continue present medications. Follow-up on stool                            samples. Further recommendations to follow.                           - No repeat colonoscopy.                           - Return to GI clinic (date not yet determined). Procedure Code(s):        --- Professional ---                           [phone], Colonoscopy, flexible; diagnostic, including                            collection of specimen(s) by brushing or washing,  when performed (separate procedure) Diagnosis Code(s):        --- Professional ---                           R93.3, Abnormal findings on diagnostic imaging of                            other parts of digestive tract CPT copyright 2019 American Medical Association. All rights reserved. The codes documented in this report are preliminary and upon coder review may  be revised to meet current compliance requirements. Gerrit Friends. Ravenna Legore, MD Gennette Pac, MD 08/07/2019 2:05:28 PM This report has been signed electronically. Number of Addenda: 0

## 2019-08-07 NOTE — Transfer of Care (Signed)
Immediate Anesthesia Transfer of Care Note  Patient: Jessica Wu  Procedure(s) Performed: COLONOSCOPY WITH PROPOFOL (N/A )  Patient Location: PACU  Anesthesia Type:MAC  Level of Consciousness: awake, oriented and patient cooperative  Airway & Oxygen Therapy: Patient Spontanous Breathing  Post-op Assessment: Report given to RN and Post -op Vital signs reviewed and stable  Post vital signs: Reviewed and stable  Last Vitals:  Vitals Value Taken Time  BP    Temp    Pulse 85 08/07/19 1410  Resp 16 08/07/19 1410  SpO2 97 % 08/07/19 1410  Vitals shown include unvalidated device data.  Last Pain:  Vitals:   08/07/19 1242  TempSrc: Oral  PainSc: 0-No pain      Patients Stated Pain Goal: 4 (75/43/60 6770)  Complications: No apparent anesthesia complications

## 2019-08-07 NOTE — Anesthesia Preprocedure Evaluation (Signed)
Anesthesia Evaluation  Patient identified by MRN, date of birth, ID band Patient awake    Reviewed: Allergy & Precautions, NPO status , Patient's Chart, lab work & pertinent test results  Airway Mallampati: I  TM Distance: >3 FB Neck ROM: Full    Dental no notable dental hx. 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   Neuro/Psych negative neurological ROS  negative psych ROS   GI/Hepatic Neg liver ROS, PUD, GERD  Medicated and Controlled,Here for colonoscopy  S/p prep  H/o colitis by previous CT scan    Endo/Other  negative endocrine ROS  Renal/GU negative Renal ROS  negative genitourinary   Musculoskeletal negative musculoskeletal ROS (+)   Abdominal   Peds negative pediatric ROS (+)  Hematology negative hematology ROS (+)   Anesthesia Other Findings   Reproductive/Obstetrics negative OB ROS                             Anesthesia Physical Anesthesia Plan  ASA: II  Anesthesia Plan: General   Post-op Pain Management:    Induction: Intravenous  PONV Risk Score and Plan: 3 and TIVA, Propofol infusion, Treatment may vary due to age or medical condition and Ondansetron  Airway Management Planned: Simple Face Mask and Nasal Cannula  Additional Equipment:   Intra-op Plan:   Post-operative Plan:   Informed Consent: I have reviewed the patients History and Physical, chart, labs and discussed the procedure including the risks, benefits and alternatives for the proposed anesthesia with the patient or authorized representative who has indicated  his/her understanding and acceptance.     Dental advisory given  Plan Discussed with: CRNA  Anesthesia Plan Comments: (Plan Full PPE use  Plan GA with GETA as needed d/w pt -WTP with same after Q&A)        Anesthesia Quick Evaluation

## 2019-08-07 NOTE — Interval H&P Note (Signed)
History and Physical Interval Note:  08/07/2019 1:24 PM  Jessica Wu  has presented today for surgery, with the diagnosis of rectal bleeding.  The various methods of treatment have been discussed with the patient and family. After consideration of risks, benefits and other options for treatment, the patient has consented to  Procedure(s) with comments: COLONOSCOPY WITH PROPOFOL (N/A) - 1:45pm as a surgical intervention.  The patient's history has been reviewed, patient examined, no change in status, stable for surgery.  I have reviewed the patient's chart and labs.  Questions were answered to the patient's satisfaction.     Jessica Wu  No change.  Colonoscopy per plan.  The risks, benefits, limitations, alternatives and imponderables have been reviewed with the patient. Questions have been answered. All parties are agreeable.

## 2019-08-11 LAB — GI PATHOGEN PANEL BY PCR, STOOL
Adenovirus F 40/41: NOT DETECTED
Astrovirus: NOT DETECTED
Campylobacter by PCR: NOT DETECTED
Cryptosporidium by PCR: NOT DETECTED
Cyclospora cayetanensis: NOT DETECTED
E coli (ETEC) LT/ST: NOT DETECTED
E coli (STEC): NOT DETECTED
E coli 0157 by PCR: NOT DETECTED
Entamoeba histolytica: NOT DETECTED
Enteroaggregative E coli: NOT DETECTED
Enteropathogenic E coli: NOT DETECTED
G lamblia by PCR: NOT DETECTED
Norovirus GI/GII: NOT DETECTED
Plesiomonas shigelloides: NOT DETECTED
Rotavirus A by PCR: NOT DETECTED
Salmonella by PCR: NOT DETECTED
Sapovirus: NOT DETECTED
Shigella by PCR: NOT DETECTED
Vibrio cholerae: NOT DETECTED
Vibrio: NOT DETECTED
Yersinia enterocolitica: NOT DETECTED

## 2019-08-20 ENCOUNTER — Other Ambulatory Visit: Payer: Self-pay

## 2019-08-20 ENCOUNTER — Ambulatory Visit (INDEPENDENT_AMBULATORY_CARE_PROVIDER_SITE_OTHER): Payer: Commercial Managed Care - PPO | Admitting: Cardiovascular Disease

## 2019-08-20 ENCOUNTER — Encounter: Payer: Self-pay | Admitting: Cardiovascular Disease

## 2019-08-20 VITALS — BP 100/68 | HR 94 | Ht 64.0 in | Wt 185.0 lb

## 2019-08-20 DIAGNOSIS — Z8249 Family history of ischemic heart disease and other diseases of the circulatory system: Secondary | ICD-10-CM | POA: Diagnosis not present

## 2019-08-20 DIAGNOSIS — R000 Tachycardia, unspecified: Secondary | ICD-10-CM | POA: Diagnosis not present

## 2019-08-20 DIAGNOSIS — R002 Palpitations: Secondary | ICD-10-CM | POA: Diagnosis not present

## 2019-08-20 NOTE — Progress Notes (Signed)
CARDIOLOGY CONSULT NOTE  Patient ID: Jessica Wu MRN: [id number] DOB/AGE: [birth year] 22 y.o.  Admit date: (Not on file) Primary Physician: Remus Loffler, PA-C  Reason for Consultation: Tachycardia  HPI: Jessica Wu is a 24 y.o. female who is being seen today for the evaluation of tachycardia at the request of Remus Loffler, PA-C.   I reviewed notes by her PCP.  She has apparently been experiencing tachycardia over the last few months.  EMS was called on 1 occasion on 06/18/2019.  Her heart rate was reportedly over 150 bpm.    There is a reported family history of SVT.  It appears she was prescribed metoprolol tartrate 25 mg to be taken as needed.  ECG performed in the office today which I ordered and personally interpreted demonstrates normal sinus rhythm with no ischemic ST segment or T-wave abnormalities, nor any arrhythmias.  For the past several months she has been experiencing tachycardia and palpitations while at rest.  It is occurred while she was at work and while she was driving.  Heart rates have been as high as 170 bpm.  She has associated shortness of breath and chest pain.  She has had dizziness but denies syncope.  While she was at work in November, her heart rate got as high as 155 bpm.  EMS was called but by the time they arrived she was reportedly in sinus tachycardia.  She works as a Science writer for [employer].  Family history: Mother underwent radiofrequency ablation for SVT.  Grandfather and great grandfather both had history of coronary artery disease and MIs and great grandfather had a pacemaker.   Allergies  Allergen Reactions  . Contrast Media [Iodinated Diagnostic Agents]     Hive, anaphylaxis  . Soap Other (See Comments)    SCENTED SOAPS/LOTIONS - SKIN IRRITATION    Current Outpatient Medications  Medication Sig Dispense Refill  . dicyclomine (BENTYL) 10 MG capsule Take 1 capsule (10 mg total) by mouth 4 (four) times  daily -  before meals and at bedtime. 120 capsule 3  . ondansetron (ZOFRAN ODT) 4 MG disintegrating tablet Take 1 tablet (4 mg total) by mouth every 8 (eight) hours as needed for nausea or vomiting. 30 tablet 3  . pantoprazole (PROTONIX) 40 MG tablet Take 1 tablet (40 mg total) by mouth daily. 30 minutes before breakfast 30 tablet 3   No current facility-administered medications for this visit.    Past Medical History:  Diagnosis Date  . Bunion of right foot 06/2017  . GERD (gastroesophageal reflux disease)   . Tachycardia     Past Surgical History:  Procedure Laterality Date  . COLONOSCOPY WITH PROPOFOL N/A 08/07/2019   Procedure: COLONOSCOPY WITH PROPOFOL;  Surgeon: Corbin Ade, MD;  [facility];  Service: Endoscopy;  Laterality: N/A;  1:45pm  . METATARSAL OSTEOTOMY WITH BUNIONECTOMY Right 07/05/2017   Procedure: Right First Metatarsal Scarf Osteotomy and Modified Orpha Bur;  Surgeon: Toni Arthurs, MD;  Location: [facility];  Service: Orthopedics;  Laterality: Right;    Social History   Socioeconomic History  . Marital status: Single    Spouse name: Not on file  . Number of children: Not on file  . Years of education: Not on file  . Highest education level: Not on file  Occupational History  . Not on file  Tobacco Use  . Smoking status: Never Smoker  . Smokeless tobacco: Never Used  Substance and Sexual Activity  .  Alcohol use: No  . Drug use: No  . Sexual activity: Yes  Other Topics Concern  . Not on file  Social History Narrative  . Not on file   Social Determinants of Health   Financial Resource Strain:   . Difficulty of Paying Living Expenses: Not on file  Food Insecurity:   . Worried About Programme researcher, broadcasting/film/video in the Last Year: Not on file  . Ran Out of Food in the Last Year: Not on file  Transportation Needs:   . Lack of Transportation (Medical): Not on file  . Lack of Transportation (Non-Medical): Not on file    Physical Activity:   . Days of Exercise per Week: Not on file  . Minutes of Exercise per Session: Not on file  Stress:   . Feeling of Stress : Not on file  Social Connections:   . Frequency of Communication with Friends and Family: Not on file  . Frequency of Social Gatherings with Friends and Family: Not on file  . Attends Religious Services: Not on file  . Active Member of Clubs or Organizations: Not on file  . Attends Banker Meetings: Not on file  . Marital Status: Not on file  Intimate Partner Violence:   . Fear of Current or Ex-Partner: Not on file  . Emotionally Abused: Not on file  . Physically Abused: Not on file  . Sexually Abused: Not on file      Current Meds  Medication Sig  . dicyclomine (BENTYL) 10 MG capsule Take 1 capsule (10 mg total) by mouth 4 (four) times daily -  before meals and at bedtime.  . ondansetron (ZOFRAN ODT) 4 MG disintegrating tablet Take 1 tablet (4 mg total) by mouth every 8 (eight) hours as needed for nausea or vomiting.  . pantoprazole (PROTONIX) 40 MG tablet Take 1 tablet (40 mg total) by mouth daily. 30 minutes before breakfast      Review of systems complete and found to be negative unless listed above in HPI    Physical exam Blood pressure 100/68, pulse 94, height 5\' 4"  (1.626 m), weight 185 lb (83.9 kg), SpO2 98 %. General: NAD Neck: No JVD, no thyromegaly or thyroid nodule.  Lungs: Clear to auscultation bilaterally with normal respiratory effort. CV: Nondisplaced PMI. Regular rate and rhythm, normal S1/S2, no S3/S4, no murmur.  No peripheral edema.  No carotid bruit.    Abdomen: Soft, nontender, no distention.  Skin: Intact without lesions or rashes.  Neurologic: Alert and oriented x 3.  Psych: Normal affect. Extremities: No clubbing or cyanosis.  HEENT: Normal.   ECG: Most recent ECG reviewed.   Labs: Lab Results  Component Value Date/Time   K 4.2 03/28/2019 06:20 PM   BUN 13 03/28/2019 06:20 PM   BUN 17  10/10/2017 09:58 AM   CREATININE 0.74 03/28/2019 06:20 PM   ALT 19 03/28/2019 06:20 PM   HGB 13.9 03/28/2019 06:20 PM   HGB 11.9 10/10/2017 09:58 AM     Lipids: No results found for: LDLCALC, LDLDIRECT, CHOL, TRIG, HDL      ASSESSMENT AND PLAN:  1.  Tachycardia and palpitations: There is a family history of SVT.  Given reported heart rates ranging from 150-170 bpm, I suspect she also may have SVT.  She was prescribed metoprolol 25 mg to be taken as needed by her PCP.  However, I would like to try to capture the arrhythmia.  I will obtain a 30-day event monitor. I will order  a 2-D echocardiogram with Doppler to evaluate cardiac structure, function, and regional wall motion.    Disposition: Follow up in 3 months  Signed: Kate Sable, M.D., F.A.C.C.  08/20/2019, 2:59 PM

## 2019-08-20 NOTE — Patient Instructions (Addendum)
Medication Instructions:   Your physician recommends that you continue on your current medications as directed. Please refer to the Current Medication list given to you today.  Labwork:  Your physician recommends that you return for non-fasting lab work in: as soon as possible to check your BMET, Mg, and TSH levels. This may be done at [facility] or [facility] in [place].   Testing/Procedures: Your physician has requested that you have an echocardiogram. Echocardiography is a painless test that uses sound waves to create images of your heart. It provides your doctor with information about the size and shape of your heart and how well your heart's chambers and valves are working. This procedure takes approximately one hour. There are no restrictions for this procedure. Your physician has recommended that you wear an event monitor for 30 days. Event monitors are medical devices that record the heart's electrical activity. Doctors most often [place] these monitors to diagnose arrhythmias. Arrhythmias are problems with the speed or rhythm of the heartbeat. The monitor is a small, portable device. You can wear one while you do your normal daily activities. This is usually used to diagnose what is causing palpitations/syncope (passing out). [employer] will send this monitor to your home address.   Follow-Up:  Your physician recommends that you schedule a follow-up appointment in: 3 months (virtual).  Any Other Special Instructions Will Be Listed Below (If Applicable).  If you need a refill on your cardiac medications before your next appointment, please call your pharmacy.

## 2019-08-21 ENCOUNTER — Other Ambulatory Visit: Payer: Self-pay | Admitting: Cardiovascular Disease

## 2019-08-21 ENCOUNTER — Telehealth: Payer: Self-pay | Admitting: *Deleted

## 2019-08-21 DIAGNOSIS — R000 Tachycardia, unspecified: Secondary | ICD-10-CM

## 2019-08-21 LAB — BASIC METABOLIC PANEL
BUN: 18 mg/dL (ref 7–25)
CO2: 28 mmol/L (ref 20–32)
Calcium: 9.4 mg/dL (ref 8.6–10.2)
Chloride: 105 mmol/L (ref 98–110)
Creat: 0.77 mg/dL (ref 0.50–1.10)
Glucose, Bld: 90 mg/dL (ref 65–139)
Potassium: 4.4 mmol/L (ref 3.5–5.3)
Sodium: 140 mmol/L (ref 135–146)

## 2019-08-21 LAB — TSH: TSH: 3.1 mIU/L

## 2019-08-21 LAB — MAGNESIUM: Magnesium: 2 mg/dL (ref 1.5–2.5)

## 2019-08-21 NOTE — Telephone Encounter (Signed)
Lesle Chris, [place]  4/48/1856 31:49 AM EST    Mail box full.

## 2019-08-21 NOTE — Telephone Encounter (Signed)
-----   Message from Laqueta Linden, MD sent at 08/21/2019  8:35 AM EST ----- Normal blood tests

## 2019-08-25 NOTE — Telephone Encounter (Signed)
Lesle Chris, [place]  2/0/8022 3:36 PM EST    Patient notified. Copy to pmd.

## 2019-08-26 ENCOUNTER — Ambulatory Visit (INDEPENDENT_AMBULATORY_CARE_PROVIDER_SITE_OTHER): Payer: Commercial Managed Care - PPO

## 2019-08-26 DIAGNOSIS — R002 Palpitations: Secondary | ICD-10-CM

## 2019-08-26 DIAGNOSIS — R000 Tachycardia, unspecified: Secondary | ICD-10-CM

## 2019-09-01 ENCOUNTER — Telehealth: Payer: Self-pay | Admitting: Cardiovascular Disease

## 2019-09-01 NOTE — Telephone Encounter (Signed)
Patient called stating that her monitor will not connect. She has tried everything to make it work. States that the monitor is making a noise at times.

## 2019-09-01 NOTE — Telephone Encounter (Signed)
Left detailed message via voice mail - advised to call customer service number on box to see if they can talk her through the set up.  If no luck, she may call the office back tomorrow.

## 2019-09-04 ENCOUNTER — Ambulatory Visit (INDEPENDENT_AMBULATORY_CARE_PROVIDER_SITE_OTHER): Payer: Commercial Managed Care - PPO

## 2019-09-04 ENCOUNTER — Other Ambulatory Visit: Payer: Self-pay

## 2019-09-04 DIAGNOSIS — R000 Tachycardia, unspecified: Secondary | ICD-10-CM | POA: Diagnosis not present

## 2019-09-09 ENCOUNTER — Telehealth: Payer: Self-pay | Admitting: *Deleted

## 2019-09-09 NOTE — Telephone Encounter (Signed)
Jessica Wu, [place]  0/72/2575 0:51 PM EST    Left message to return call.

## 2019-09-09 NOTE — Telephone Encounter (Signed)
Laqueta Linden, MD  09/04/2019 5:34 PM EST    Normal echo

## 2019-09-09 NOTE — Telephone Encounter (Signed)
Jessica Wu, [place]  1/61/0960 4:54 PM EST    Patient notified. Copy to pmd. Follow up scheduled for April with Dr. Purvis Sheffield (virtual).

## 2019-09-09 NOTE — Telephone Encounter (Signed)
The patient verbally consented for a telehealth phone visit with [facility] and understands that his/her insurance company will be billed for the encounter.  Will have vitals & medications.   

## 2019-10-08 ENCOUNTER — Telehealth: Payer: Self-pay | Admitting: *Deleted

## 2019-10-08 NOTE — Telephone Encounter (Signed)
Jessica Wu, [place]  4/84/7207 2:18 PM EDT    Mailbox full.

## 2019-10-08 NOTE — Telephone Encounter (Signed)
-----   Message from Laqueta Linden, MD sent at 10/07/2019 12:00 PM EDT ----- Sinus tachycardia was seen.  No SVT.  She could try low-dose Lopressor 12.5 mg twice daily.  If she decides to do so, have her call us back 2 weeks after taking the medication to let us know how she is doing.

## 2019-10-14 MED ORDER — METOPROLOL TARTRATE 25 MG PO TABS: 12.5000 mg | ORAL_TABLET | Freq: Two times a day (BID) | ORAL | Status: DC

## 2019-10-14 NOTE — Telephone Encounter (Signed)
Jessica Wu, [place]  2/37/6283 1:51 PM EDT    Patient notified. She already has Lopressor 25mg  that was given by her pcp on 07-02-2019. Did get filled, but was only to be used as needed. Per Dr. 14-03-2019 instructions - she will use what she already has & take 1/2 tab (12.5mg ) twice a day & call Purvis Sheffield back with update in 2 weeks. Follow up scheduled for 11/17/2019 (vv).

## 2019-10-24 ENCOUNTER — Telehealth: Payer: Self-pay | Admitting: Cardiovascular Disease

## 2019-10-24 NOTE — Telephone Encounter (Signed)
Patient called stating that she continues to have chest pains since she began taking the metoprolol tartrate (LOPRESSOR) 25 MG Wants to know if she can take something else with medication. States that she is having some shortness of breath with dizziness and light headed.

## 2019-10-28 NOTE — Telephone Encounter (Signed)
By chest pains is she referring to palpitations? How has she been taking the metoprolol?   Dominga Ferry MD

## 2019-10-28 NOTE — Telephone Encounter (Signed)
Both - palpitations & chest pain.  Doing the Lopressor twice a day.  Sunday - just sitting still - heart rate went up to 135.  Did have chest pain & sob during that episode.  Lasted about 5 minutes or so.  Been on the Lopressor x 2 weeks.  Patient stated that she did discuss other options such as ablation with Dr. Purvis Sheffield - want to see if this is an option.  States that she has chest pain almost every day - may be with palpitations or not.  Fast heart rate almost all the time with the chest pain.    The only change that she notices is her resting heart rate may be down to the 50's since starting the Lopressor.  Doing Lopressor 25mg  - 1/2 tab twice a day.

## 2019-10-29 NOTE — Telephone Encounter (Signed)
Please forward to Dr K  J Josely Moffat MD 

## 2019-10-30 NOTE — Telephone Encounter (Signed)
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  Sinus tachycardia is not a rhythm which is amenable to ablation.

## 2019-11-03 MED ORDER — IVABRADINE HCL 5 MG PO TABS: 5.0000 mg | ORAL_TABLET | Freq: Two times a day (BID) | ORAL | 6 refills | Status: AC

## 2019-11-03 NOTE — Addendum Note (Signed)
Addended by: Eustace Moore on: 11/03/2019 04:46 PM   Modules accepted: Orders

## 2019-11-03 NOTE — Telephone Encounter (Signed)
Patient informed and verbalized understanding of plan. 

## 2019-11-17 ENCOUNTER — Encounter: Payer: Self-pay | Admitting: Cardiovascular Disease

## 2019-11-17 ENCOUNTER — Telehealth (INDEPENDENT_AMBULATORY_CARE_PROVIDER_SITE_OTHER): Payer: Commercial Managed Care - PPO | Admitting: Cardiovascular Disease

## 2019-11-17 VITALS — Ht 64.0 in | Wt 190.0 lb

## 2019-11-17 DIAGNOSIS — R002 Palpitations: Secondary | ICD-10-CM | POA: Diagnosis not present

## 2019-11-17 DIAGNOSIS — R000 Tachycardia, unspecified: Secondary | ICD-10-CM

## 2019-11-17 NOTE — Progress Notes (Signed)
Virtual Visit via Telephone Note   This visit type was conducted due to national recommendations for restrictions regarding the COVID-19 Pandemic (e.g. social distancing) in an effort to limit this patient's exposure and mitigate transmission in our community.  Due to her co-morbid illnesses, this patient is at least at moderate risk for complications without adequate follow up.  This format is felt to be most appropriate for this patient at this time.  The patient did not have access to video technology/had technical difficulties with video requiring transitioning to audio format only (telephone).  All issues noted in this document were discussed and addressed.  No physical exam could be performed with this format.  Please refer to the patient's chart for her  consent to telehealth for [facility].   The patient was identified using 2 identifiers.  Date:  11/17/2019   ID:  Jessica Wu, DOB 12-28-1995, MRN [id number]  Patient Location: Home Provider Location: Office  PCP:  Terald Sleeper, PA-C  Cardiologist:  Kate Sable, MD  Electrophysiologist:  None   Evaluation Performed:  Follow-Up Visit  Chief Complaint: Tachycardia and palpitations  History of Present Illness:    Jessica Wu is a 24 y.o. female with a history of palpitations and inappropriate sinus tachycardia.  I prescribed ivabradine 5 mg twice daily for inappropriate sinus tachycardia/palpitations on 10/30/2019.  She has yet to pick up the prescription because she has been busy working.  She has not been taking anything.  She continues to experience episodic chest pain and palpitations.  Social history: She works as a Counsellor for [employer].   Past Medical History:  Diagnosis Date  . Bunion of right foot 06/2017  . GERD (gastroesophageal reflux disease)   . Tachycardia    Past Surgical History:  Procedure Laterality Date  . COLONOSCOPY WITH PROPOFOL N/A 08/07/2019   Procedure:  COLONOSCOPY WITH PROPOFOL;  Surgeon: Daneil Dolin, MD;  [facility];  Service: Endoscopy;  Laterality: N/A;  1:45pm  . METATARSAL OSTEOTOMY WITH BUNIONECTOMY Right 07/05/2017   Procedure: Right First Metatarsal Scarf Osteotomy and Modified Alphonzo Grieve;  Surgeon: Wylene Simmer, MD;  Location: [facility];  Service: Orthopedics;  Laterality: Right;     Current Meds  Medication Sig  . dicyclomine (BENTYL) 10 MG capsule Take 1 capsule (10 mg total) by mouth 4 (four) times daily -  before meals and at bedtime.  . ivabradine (CORLANOR) 5 MG TABS tablet Take 1 tablet (5 mg total) by mouth 2 (two) times daily with a meal.  . ondansetron (ZOFRAN ODT) 4 MG disintegrating tablet Take 1 tablet (4 mg total) by mouth every 8 (eight) hours as needed for nausea or vomiting.  . pantoprazole (PROTONIX) 40 MG tablet Take 1 tablet (40 mg total) by mouth daily. 30 minutes before breakfast     Allergies:   Contrast media [iodinated diagnostic agents] and Soap   Social History   Tobacco Use  . Smoking status: Never Smoker  . Smokeless tobacco: Never Used  Substance Use Topics  . Alcohol use: No  . Drug use: No     Family Hx: The patient's family history includes Heart disease in her maternal grandfather and mother; Irritable bowel syndrome in her father. There is no history of Crohn's disease, Ulcerative colitis, Colon cancer, or Colon polyps.  ROS:   Please see the history of present illness.     All other systems reviewed and are negative.   Prior CV studies:  The following studies were reviewed today:  Normal echocardiogram on 09/04/2019, LVEF 60 to 65%.  Event monitor in February 2021 showed sinus rhythm with sinus tachycardia with average heart rate of 95 bpm and maximum heart rate 170 bpm.  Symptoms correlated with sinus tachycardia.  Labs/Other Tests and Data Reviewed:    EKG:  No ECG reviewed.  Recent Labs: 03/28/2019: ALT 19; Hemoglobin 13.9;  Platelets 310 08/20/2019: BUN 18; Creat 0.77; Magnesium 2.0; Potassium 4.4; Sodium 140; TSH 3.10   Recent Lipid Panel No results found for: CHOL, TRIG, HDL, CHOLHDL, LDLCALC, LDLDIRECT  Wt Readings from Last 3 Encounters:  11/17/19 190 lb (86.2 kg)  08/20/19 185 lb (83.9 kg)  08/06/19 180 lb (81.6 kg)     Objective:    Vital Signs:  Ht 5\' 4"  (1.626 m)   Wt 190 lb (86.2 kg)   BMI 32.61 kg/m    VITAL SIGNS:  reviewed  ASSESSMENT & PLAN:    1.  Inappropriate sinus tachycardia/palpitations: I prescribed ivabradine 5 mg twice daily for inappropriate sinus tachycardia/palpitations on 10/30/2019.  She has yet to pick up the prescription due to busyness at work.  I will reevaluate in 3 months.    COVID-19 Education: The signs and symptoms of COVID-19 were discussed with the patient and how to seek care for testing (follow up with PCP or arrange E-visit).  The importance of social distancing was discussed today.  Time:   Today, I have spent 15 minutes with the patient with telehealth technology discussing the above problems.     Medication Adjustments/Labs and Tests Ordered: Current medicines are reviewed at length with the patient today.  Concerns regarding medicines are outlined above.   Tests Ordered: No orders of the defined types were placed in this encounter.   Medication Changes: No orders of the defined types were placed in this encounter.   Follow Up:  Virtual Visit  in 3 month(s)  Signed, 12/30/2019, MD  11/17/2019 4:45 PM    [facility]

## 2019-11-17 NOTE — Patient Instructions (Signed)
Medication Instructions:  Continue all current medications.  Labwork: none  Testing/Procedures: none  Follow-Up: 3 months   Any Other Special Instructions Will Be Listed Below (If Applicable).  If you need a refill on your cardiac medications before your next appointment, please call your pharmacy.  

## 2020-02-18 ENCOUNTER — Telehealth: Payer: Commercial Managed Care - PPO | Admitting: Cardiology

## 2020-02-18 ENCOUNTER — Telehealth: Payer: Commercial Managed Care - PPO | Admitting: Cardiovascular Disease

## 2020-02-18 NOTE — Progress Notes (Unsigned)
{Choose 1 Note Type (Video or Telephone):[phone]}    Date:  02/18/2020   ID:  Thomasene Lot, DOB 05-Oct-1995, MRN [id number] The patient was identified using 2 identifiers.  {Patient Location:[phone]::"Home"} {Provider Location:[phone]::"Home Office"}  PCP:  Remus Loffler, PA-C  Cardiologist:  Dina Rich, MD *** Electrophysiologist:  None   Evaluation Performed:  {Choose Visit Type:[phone]::"Follow-Up Visit"}  Chief Complaint:  ***  History of Present Illness:    Jessica Wu is a 24 y.o. female with ***  1. Palpitations - from Dr Junius Argyle notes history of inappropriate sinus tach - started on ivabaradine 10/2019 - had tried lopressor previously, limited effect   The patient {does/does not:200015} have symptoms concerning for COVID-19 infection (fever, chills, cough, or new shortness of breath).    Past Medical History:  Diagnosis Date   Bunion of right foot 06/2017   GERD (gastroesophageal reflux disease)    Tachycardia    Past Surgical History:  Procedure Laterality Date   COLONOSCOPY WITH PROPOFOL N/A 08/07/2019   Procedure: COLONOSCOPY WITH PROPOFOL;  Surgeon: Corbin Ade, MD;  [facility];  Service: Endoscopy;  Laterality: N/A;  1:45pm   METATARSAL OSTEOTOMY WITH BUNIONECTOMY Right 07/05/2017   Procedure: Right First Metatarsal Scarf Osteotomy and Modified Orpha Bur;  Surgeon: Toni Arthurs, MD;  Location: [facility];  Service: Orthopedics;  Laterality: Right;     No outpatient medications have been marked as taking for the 02/18/20 encounter (Appointment) with Antoine Poche, MD.     Allergies:   Contrast media [iodinated diagnostic agents] and Soap   Social History   Tobacco Use   Smoking status: Never Smoker   Smokeless tobacco: Never Used  Vaping Use   Vaping Use: Never used  Substance Use Topics   Alcohol use: No   Drug use: No     Family Hx: The patient's family  history includes Heart disease in her maternal grandfather and mother; Irritable bowel syndrome in her father. There is no history of Crohn's disease, Ulcerative colitis, Colon cancer, or Colon polyps.  ROS:   Please see the history of present illness.    *** All other systems reviewed and are negative.   Prior CV studies:   The following studies were reviewed today:  08/2019 heart monitor  Sinus rhythm and sinus tachycardia, average heart rate 95 bpm. Maximum heart rate 170 bpm. Isolated PVC.  Symptoms correlated with sinus tachycardia. There were no worrisome arrhythmias.   08/2019 echo IMPRESSIONS    1. Left ventricular ejection fraction, by estimation, is 60 to 65%. The  left ventricle has normal function. The left ventrical has no regional  wall motion abnormalities. Left ventricular diastolic parameters were  normal.  2. Right ventricular systolic function is normal. The right ventricular  size is normal. There is normal pulmonary artery systolic pressure.  3. The mitral valve is normal in structure and function. trivial mitral  valve regurgitation.  4. The aortic valve is tricuspid. Aortic valve regurgitation is not  visualized. No aortic stenosis is present.  5. The inferior vena cava is normal in size with greater than 50%  respiratory variability, suggesting right atrial pressure of 3 mmHg.   Labs/Other Tests and Data Reviewed:    EKG:  {EKG/Telemetry Strips Reviewed:[phone]}  Recent Labs: 03/28/2019: ALT 19; Hemoglobin 13.9; Platelets 310 08/20/2019: BUN 18; Creat 0.77; Magnesium 2.0; Potassium 4.4; Sodium 140; TSH 3.10   Recent Lipid Panel No results found for: CHOL, TRIG, HDL, CHOLHDL, LDLCALC, LDLDIRECT  Wt  Readings from Last 3 Encounters:  11/17/19 190 lb (86.2 kg)  08/20/19 185 lb (83.9 kg)  08/06/19 180 lb (81.6 kg)     Objective:    Vital Signs:  There were no vitals taken for this visit.   {[facility] Virtual Exam  (Optional):[phone]::"VITAL SIGNS:  reviewed"}  ASSESSMENT & PLAN:    1. ***  COVID-19 Education: The signs and symptoms of COVID-19 were discussed with the patient and how to seek care for testing (follow up with PCP or arrange E-visit).  ***The importance of social distancing was discussed today.  Time:   Today, I have spent *** minutes with the patient with telehealth technology discussing the above problems.     Medication Adjustments/Labs and Tests Ordered: Current medicines are reviewed at length with the patient today.  Concerns regarding medicines are outlined above.   Tests Ordered: No orders of the defined types were placed in this encounter.   Medication Changes: No orders of the defined types were placed in this encounter.   Follow Up:  {F/U [phone]} {follow up:15908}  Signed, Dina Rich, MD  02/18/2020 8:13 AM    [facility]

## 2020-09-09 IMAGING — CT CT ABD-PELV W/ CM
2 of 4 series · 17 of 46 positions shown, 19 images · IV contrast (omnipaque)
Comparison: 04/30/2017

CLINICAL DATA: Abdominal pain

EXAM:
CT ABDOMEN AND PELVIS WITH CONTRAST
TECHNIQUE: Multidetector CT imaging of the abdomen and pelvis was performed
using the standard protocol following bolus administration of
intravenous contrast.
CONTRAST:  100mL OMNIPAQUE IOHEXOL 300 MG/ML  SOLN

[Series 2: axial st · axial · 0.72mm/px · z∈[+800,+1245]mm · 14 of 103 slices shown, 16 images]
[im 7/103  soft-tissue]
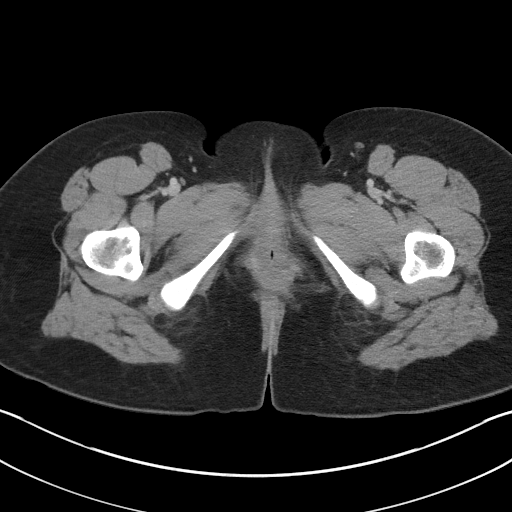
[im 7/103  bone]
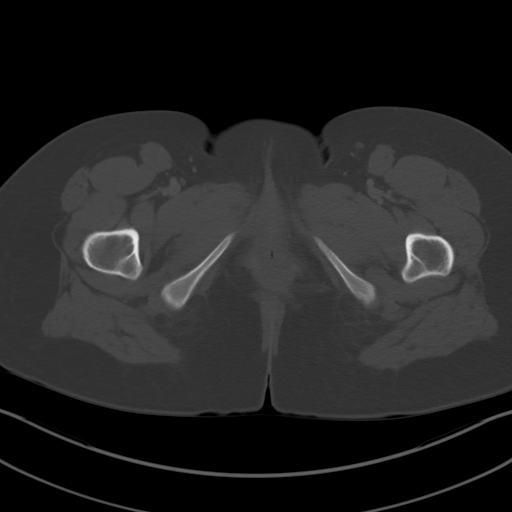
[im 13/103  soft-tissue]
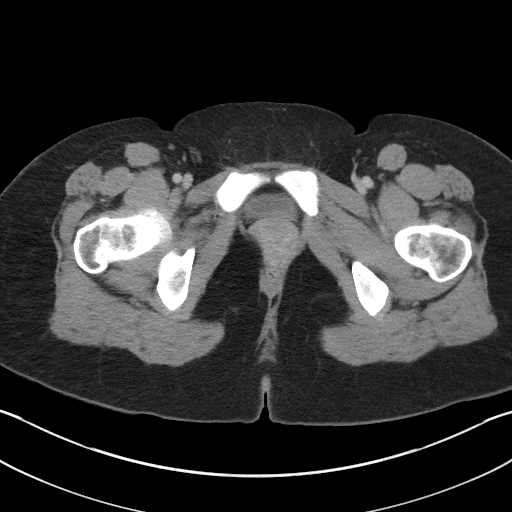
[im 20/103  soft-tissue]
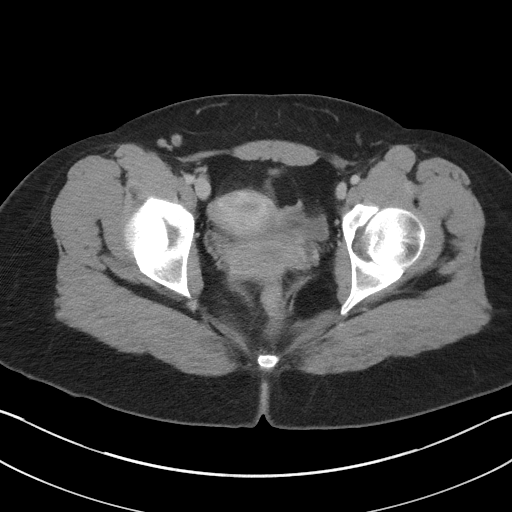
[im 26/103  soft-tissue]
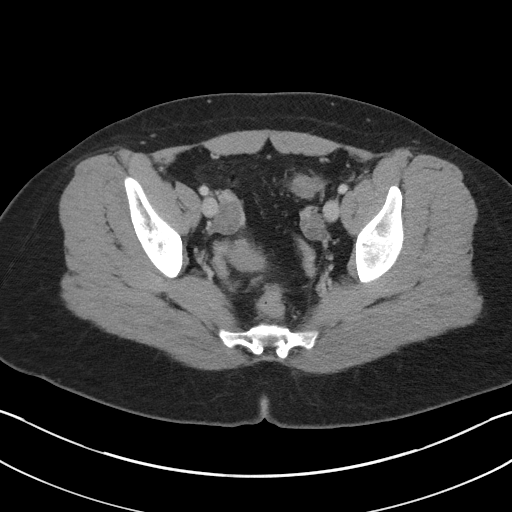
[im 32/103  soft-tissue]
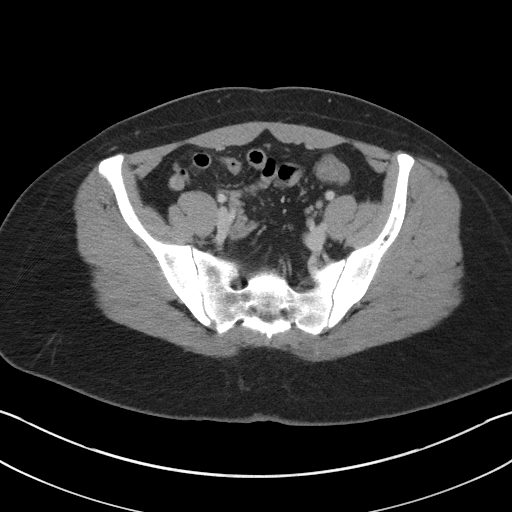
[im 39/103  soft-tissue]
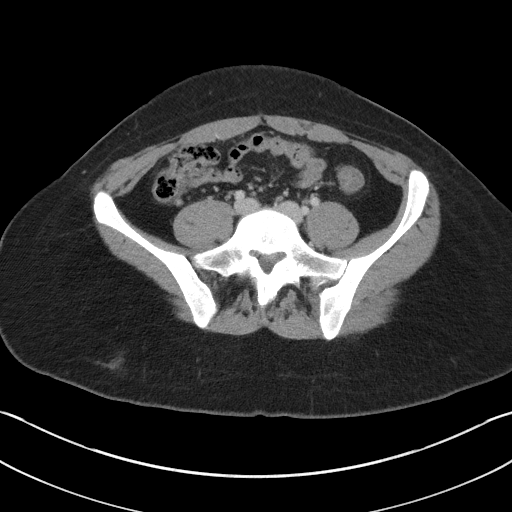
[im 45/103  soft-tissue]
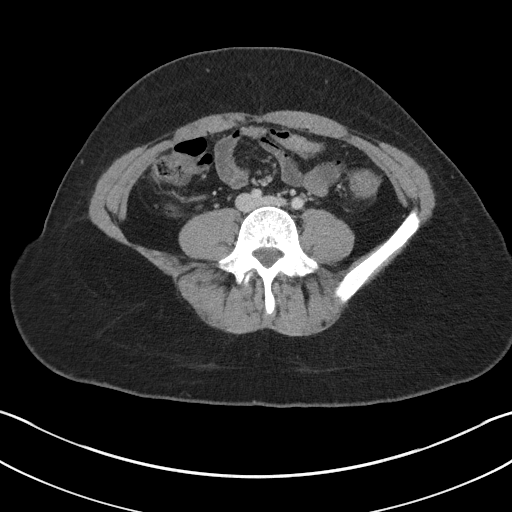
[im 58/103  soft-tissue]
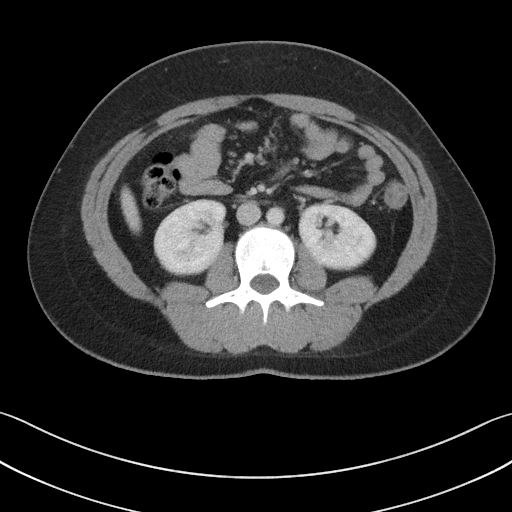
[im 64/103  soft-tissue]
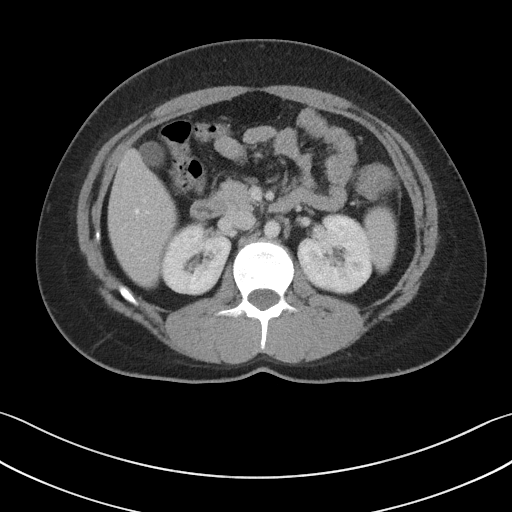
[im 64/103  bone]
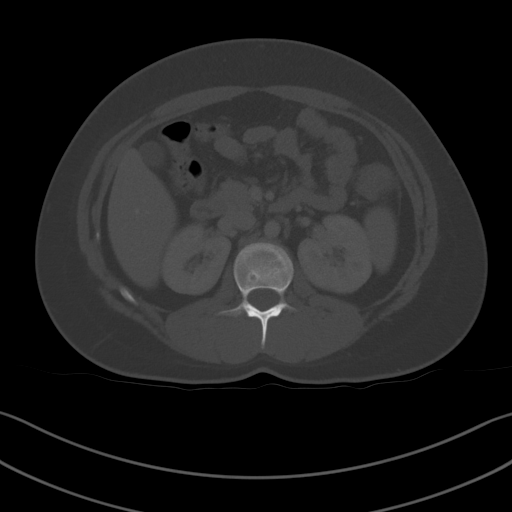
[im 71/103  soft-tissue]
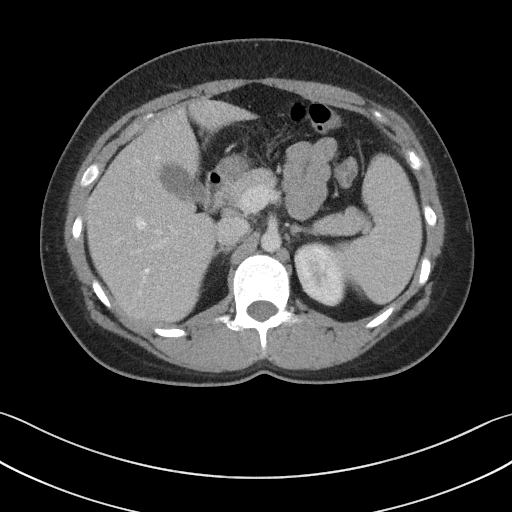
[im 77/103  soft-tissue]
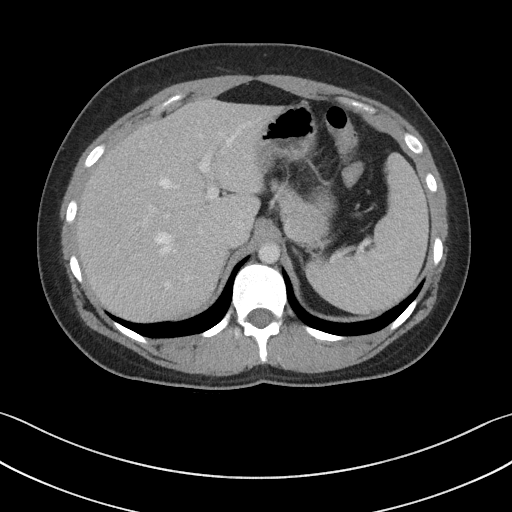
[im 83/103  soft-tissue]
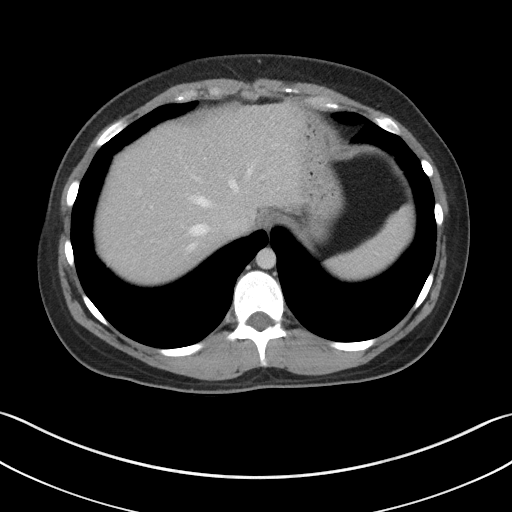
[im 90/103  soft-tissue]
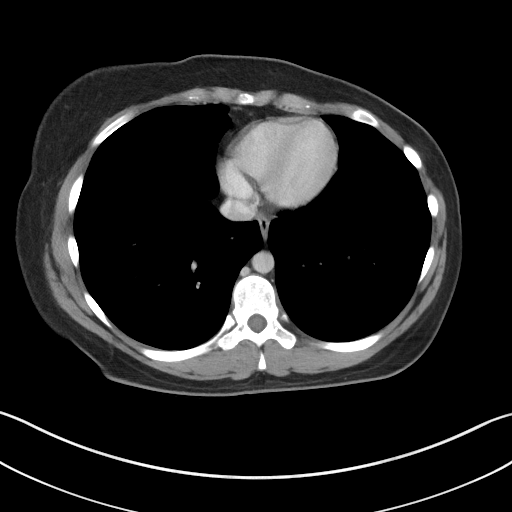
[im 96/103  soft-tissue]
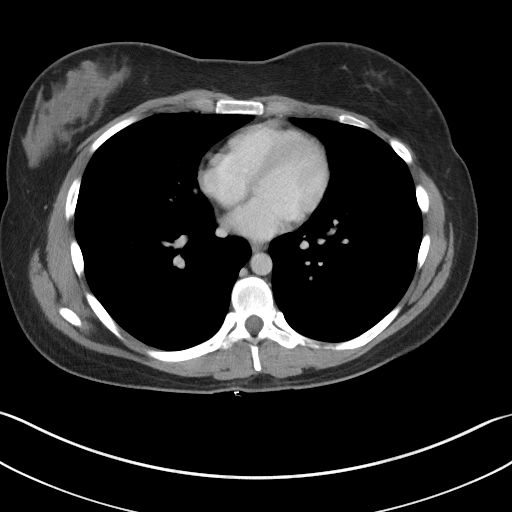

[Series 5: coronal st · coronal · 0.90mm/px · 3 of 115 slices shown]
[im 39/115  soft-tissue]
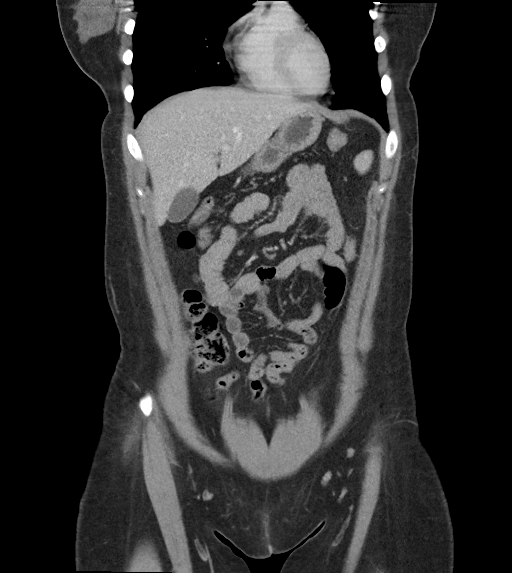
[im 51/115  soft-tissue]
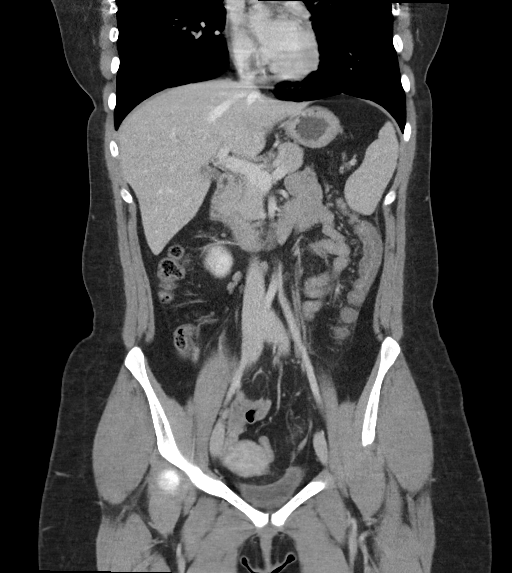
[im 64/115  soft-tissue]
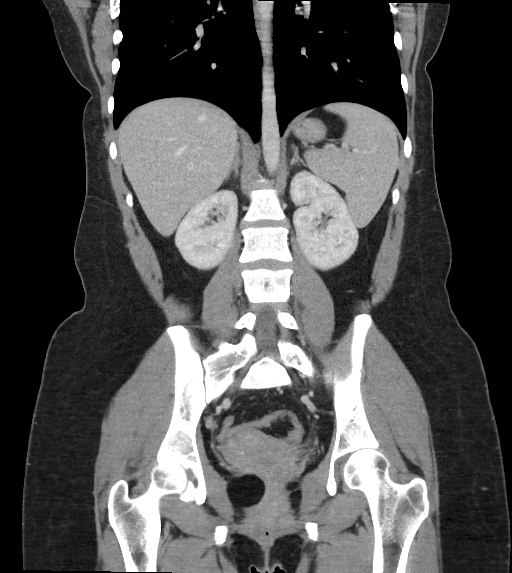

[17 of 46 positions shown; findings below may reference images not displayed]

FINDINGS: Lower chest: Lung bases are clear. No effusions. Heart is normal
size.

Hepatobiliary: No focal hepatic abnormality. Gallbladder
unremarkable.

Pancreas: No focal abnormality or ductal dilatation.

Spleen: No focal abnormality.  Normal size.

Adrenals/Urinary Tract: No adrenal abnormality. No focal renal
abnormality. No stones or hydronephrosis. Urinary bladder is
unremarkable.

Stomach/Bowel: There is wall thickening and surrounding inflammation
involving the descending colon and proximal sigmoid colon compatible
with infectious or inflammatory colitis. Stomach and small bowel
decompressed, unremarkable. No evidence of bowel obstruction. Normal
appendix.

Vascular/Lymphatic: No evidence of aneurysm or adenopathy.

Reproductive: Uterus and adnexa unremarkable.  No mass.

Other: No free fluid or free air.

Musculoskeletal: No acute bony abnormality.
IMPRESSION: Mild wall thickening and surrounding inflammation involving the
descending colon and proximal sigmoid colon compatible with
infectious or inflammatory colitis.

## 2020-11-15 ENCOUNTER — Telehealth: Payer: Self-pay | Admitting: Internal Medicine

## 2020-11-15 NOTE — Telephone Encounter (Signed)
Pt called to say that she was looking on her Mychart and noticed she had a CT done back in 03/2019 and realized she never heard back for her results. Please call [phone]

## 2020-11-16 ENCOUNTER — Telehealth: Payer: Self-pay | Admitting: Internal Medicine

## 2020-11-16 NOTE — Telephone Encounter (Signed)
Spoke with pt yesterday. Pt stated that she was never given her TCS results from a few years ago. I read the report to pt. Pt is asking to speak with a provider because she feels results are abnormal.

## 2020-11-16 NOTE — Telephone Encounter (Signed)
Spoke with patient and reassured her everything was normal. She had no further questions.

## 2020-11-16 NOTE — Telephone Encounter (Signed)
See phone note. Discussed results with pt.

## 2020-11-16 NOTE — Telephone Encounter (Signed)
Pt called back again this morning asking to speak to a NP, PA or MD. I told her everyone here was with patients and I could take a message. She said she had called yesterday but didn't understand fully what the nurse was telling her and wanted someone else to explain. [phone]
